# Patient Record
Sex: Female | Born: 1965 | Race: White | Hispanic: No | Marital: Married | Smoking: Never smoker
Health system: Southern US, Community
[De-identification: ages and names within clinical notes are randomized; demographics above are authoritative.]

## PROBLEM LIST (undated history)

## (undated) DIAGNOSIS — B019 Varicella without complication: Secondary | ICD-10-CM

## (undated) DIAGNOSIS — G43909 Migraine, unspecified, not intractable, without status migrainosus: Secondary | ICD-10-CM

## (undated) DIAGNOSIS — T7840XA Allergy, unspecified, initial encounter: Secondary | ICD-10-CM

## (undated) DIAGNOSIS — K219 Gastro-esophageal reflux disease without esophagitis: Secondary | ICD-10-CM

## (undated) DIAGNOSIS — M069 Rheumatoid arthritis, unspecified: Secondary | ICD-10-CM

## (undated) DIAGNOSIS — IMO0002 Reserved for concepts with insufficient information to code with codable children: Secondary | ICD-10-CM

## (undated) DIAGNOSIS — E079 Disorder of thyroid, unspecified: Secondary | ICD-10-CM

## (undated) HISTORY — DX: Allergy, unspecified, initial encounter: T78.40XA

## (undated) HISTORY — PX: CHOLECYSTECTOMY: SHX55

## (undated) HISTORY — PX: ABDOMINAL HYSTERECTOMY: SHX81

## (undated) HISTORY — DX: Varicella without complication: B01.9

## (undated) HISTORY — DX: Reserved for concepts with insufficient information to code with codable children: IMO0002

## (undated) HISTORY — PX: TUBAL LIGATION: SHX77

## (undated) HISTORY — DX: Gastro-esophageal reflux disease without esophagitis: K21.9

---

## 1999-05-06 ENCOUNTER — Encounter: Payer: Self-pay | Admitting: Family Medicine

## 1999-05-06 ENCOUNTER — Ambulatory Visit (HOSPITAL_COMMUNITY): Admission: RE | Admit: 1999-05-06 | Discharge: 1999-05-06 | Payer: Self-pay | Admitting: Family Medicine

## 2001-11-22 ENCOUNTER — Encounter: Payer: Self-pay | Admitting: Emergency Medicine

## 2001-11-22 ENCOUNTER — Emergency Department (HOSPITAL_COMMUNITY): Admission: EM | Admit: 2001-11-22 | Discharge: 2001-11-22 | Payer: Self-pay | Admitting: Emergency Medicine

## 2006-02-08 ENCOUNTER — Ambulatory Visit: Payer: Self-pay | Admitting: Family Medicine

## 2006-02-14 ENCOUNTER — Ambulatory Visit: Payer: Self-pay | Admitting: Family Medicine

## 2006-03-16 ENCOUNTER — Ambulatory Visit: Payer: Self-pay | Admitting: Family Medicine

## 2006-05-15 ENCOUNTER — Emergency Department (HOSPITAL_COMMUNITY): Admission: EM | Admit: 2006-05-15 | Discharge: 2006-05-15 | Payer: Self-pay | Admitting: Family Medicine

## 2006-06-19 ENCOUNTER — Emergency Department (HOSPITAL_COMMUNITY): Admission: EM | Admit: 2006-06-19 | Discharge: 2006-06-19 | Payer: Self-pay | Admitting: Family Medicine

## 2006-10-27 ENCOUNTER — Ambulatory Visit: Payer: Self-pay | Admitting: Family Medicine

## 2006-11-06 ENCOUNTER — Emergency Department (HOSPITAL_COMMUNITY): Admission: EM | Admit: 2006-11-06 | Discharge: 2006-11-06 | Payer: Self-pay | Admitting: Family Medicine

## 2007-03-22 ENCOUNTER — Emergency Department (HOSPITAL_COMMUNITY): Admission: EM | Admit: 2007-03-22 | Discharge: 2007-03-22 | Payer: Self-pay | Admitting: Emergency Medicine

## 2012-01-23 ENCOUNTER — Emergency Department (HOSPITAL_COMMUNITY)
Admission: EM | Admit: 2012-01-23 | Discharge: 2012-01-23 | Disposition: A | Discharge status: Home / Self Care | Payer: BC Managed Care – PPO | Attending: Emergency Medicine | Admitting: Emergency Medicine

## 2012-01-23 ENCOUNTER — Encounter (HOSPITAL_COMMUNITY): Payer: Self-pay | Admitting: *Deleted

## 2012-01-23 DIAGNOSIS — Z882 Allergy status to sulfonamides status: Secondary | ICD-10-CM | POA: Insufficient documentation

## 2012-01-23 DIAGNOSIS — Z880 Allergy status to penicillin: Secondary | ICD-10-CM | POA: Insufficient documentation

## 2012-01-23 DIAGNOSIS — Z90710 Acquired absence of both cervix and uterus: Secondary | ICD-10-CM | POA: Insufficient documentation

## 2012-01-23 DIAGNOSIS — Z885 Allergy status to narcotic agent status: Secondary | ICD-10-CM | POA: Insufficient documentation

## 2012-01-23 DIAGNOSIS — E079 Disorder of thyroid, unspecified: Secondary | ICD-10-CM | POA: Insufficient documentation

## 2012-01-23 DIAGNOSIS — Z9089 Acquired absence of other organs: Secondary | ICD-10-CM | POA: Insufficient documentation

## 2012-01-23 DIAGNOSIS — M62838 Other muscle spasm: Secondary | ICD-10-CM

## 2012-01-23 DIAGNOSIS — M5380 Other specified dorsopathies, site unspecified: Secondary | ICD-10-CM | POA: Insufficient documentation

## 2012-01-23 HISTORY — DX: Disorder of thyroid, unspecified: E07.9

## 2012-01-23 HISTORY — DX: Migraine, unspecified, not intractable, without status migrainosus: G43.909

## 2012-01-23 MED ORDER — METHOCARBAMOL 500 MG PO TABS: 1000.0000 mg | ORAL_TABLET | Freq: Four times a day (QID) | ORAL | Status: AC

## 2012-01-23 MED ORDER — IBUPROFEN 800 MG PO TABS: 800.0000 mg | ORAL_TABLET | Freq: Three times a day (TID) | ORAL | Status: AC | PRN

## 2012-01-23 MED ORDER — KETOROLAC TROMETHAMINE 30 MG/ML IJ SOLN
30.0000 mg | Freq: Once | INTRAMUSCULAR | Status: AC
  Administered 2012-01-23: 30 mg via INTRAMUSCULAR
  Filled 2012-01-23: qty 1

## 2012-01-23 MED ORDER — HYDROCODONE-ACETAMINOPHEN 5-325 MG PO TABS: ORAL_TABLET | ORAL | Status: AC

## 2012-01-23 NOTE — ED Notes (Signed)
Hx of Sinusitis, treated with Z-Pack. Continues to cough. C/o fluid behind ears. Hx of back: 2000 DX herniated disc L3-4. Went to [facility] with resolution. Neurosurgeon decided not to operation. 01/21/12 patient twisted to left .Marland Kitchen.felt sudden "pull" "bolt of lightening sharp pain". C/O of pain in area of L3-4 radiate to right flank. No radiation down legs.

## 2012-01-23 NOTE — Discharge Instructions (Signed)
Please read and follow all provided instructions.  Your diagnoses today include:  1. Muscle spasm     Tests performed today include:  Vital signs - see below for your results today  Medications prescribed:   Vicodin (hydrocodone/acetaminophen) - narcotic pain medication  You have been prescribed narcotic pain medication such as Vicodin or Percocet: DO NOT drive or perform any activities that require you to be awake and alert because this medicine can make you drowsy. BE VERY CAREFUL not to take multiple medicines containing Tylenol (also called acetaminophen). Doing so can lead to an overdose which can damage your liver and cause liver failure and possibly death.    Robaxin (methocarbamol) - muscle relaxer medication  You have been prescribed a muscle relaxer medication such as Robaxin, Flexeril, or Valium: DO NOT drive or perform any activities that require you to be awake and alert because this medicine can make you drowsy.    Ibuprofen - anti-inflammatory pain medication  Do not exceed 800mg  ibuprofen every 8 hours  You have been prescribed an anti-inflammatory medication or NSAID. Take with food. Take smallest effective dose for the shortest duration needed for your pain. Stop taking if you experience stomach pain or vomiting.   Take any prescribed medications only as directed.  Home care instructions:   Follow any educational materials contained in this packet  Please rest, use ice or heat on your back for the next several days  Do not lift, push, pull anything more than 10 pounds for the next week  Follow-up instructions: Please follow-up with your primary care provider in the next 1 week for further evaluation of your symptoms. If you do not have a primary care doctor -- see below for referral information.   Return instructions:  SEEK IMMEDIATE MEDICAL ATTENTION IF YOU HAVE:  New numbness, tingling, weakness, or problem with the use of your arms or legs  Severe  back pain not relieved with medications  Loss control of your bowels or bladder  Increasing pain in any areas of the body (such as chest or abdominal pain)  Shortness of breath, dizziness, or fainting.   Worsening nausea (feeling sick to your stomach), vomiting, fever, or sweats  Any other emergent concerns regarding your health   Additional Information:  Your vital signs today were: BP 105/61  Pulse 83  Temp(Src) 99.2 F (37.3 C) (Oral)  Resp 16  SpO2 97% If your blood pressure (BP) was elevated above 135/85 this visit, please have this repeated by your doctor within one month. -------------- No Primary Care Doctor Call [facility]  [phone] Other agencies that provide inexpensive medical care    [facility]  [phone]    [facility]  [phone]    [facility]  [phone]    [facility]  [phone]    [facility]  [phone]    [facility]  [phone] -------------- RESOURCE GUIDE:  Dental Problems  Patients with Medicaid: [facility] [phone] [address]                                            [phone] [address] Phone:  [phone]  Phone:  [phone]  If unable to pay or uninsured, contact:  [facility] or [facility]. to become qualified for the adult dental clinic.  Chronic Pain Problems Contact [facility]  [phone] Patients need to be referred by their primary care doctor.  Insufficient Money for Medicine Contact [employer]:  call "211" or [facility] [phone].  Psychological Services [facility]  [phone] [facility]  [phone] [facility]   [phone] (emergency services [phone])  Substance Abuse Resources [facility]  [phone] [facility] [phone] [facility]  [phone] Floydene Flock [phone] Residential & Outpatient Substance Abuse Program  [phone]  Abuse/Neglect [facility] Child Abuse Hotline ([phone] [facility] Child Abuse Hotline [phone] (After Hours)  Emergency Shelter [facility] [phone]  Maternity Homes [facility] at [facility] [place] [facility] [phone] [facility] [phone]  [facility] Resources  [facility]     [employer]                          [facility]. [address] [place]                       [address]      [address]  Blondell Reveal Phone:  [phone]                                   Phone:  [phone]                 Phone:  [phone]  [facility] Mental [facility] Phone:  [phone]  [facility] Child Abuse Hotline [phone] (After Hours)

## 2012-01-23 NOTE — ED Provider Notes (Signed)
History     CSN: [id number]  Arrival date & time 01/23/12  1046   First MD Initiated Contact with Patient 01/23/12 1116      Chief Complaint  Patient presents with  . Back Pain    (Consider location/radiation/quality/duration/timing/severity/associated sxs/prior treatment) HPI Comments: Patient presents with right middle back pain that started 2 days ago after she twisted her back. Patient states that she has been coughing for the past week due to an upper respiratory tract infection that was treated with a Z-Pak she has completed. Her back pain is worse with movement and with deep breathing. Patient is not short of breath. She states she has to hold her back when she coughs. Patient took a Flexeril that she had at home last night with mild relief.  Patient is a 46 y.o. female presenting with back pain. The history is provided by the patient.  Back Pain  This is a new problem. The current episode started 2 days ago. The problem occurs constantly. The problem has not changed since onset.The pain is associated with twisting. The quality of the pain is described as aching. The pain does not radiate. Pertinent negatives include no chest pain, no fever, no numbness, no abdominal pain, no bowel incontinence, no perianal numbness, no bladder incontinence, no dysuria, no pelvic pain, no leg pain, no paresthesias, no paresis, no tingling and no weakness. She has tried muscle relaxants and NSAIDs for the symptoms. The treatment provided mild relief.    Past Medical History  Diagnosis Date  . Thyroid disease   . Migraine headache     Past Surgical History  Procedure Date  . Cholecystectomy   . Abdominal hysterectomy   . Tubal ligation     No family history on file.  History  Substance Use Topics  . Smoking status: Never Smoker   . Smokeless tobacco: Not on file  . Alcohol Use: No    OB History    Grav Para Term Preterm Abortions TAB SAB Ect Mult Living                  Review  of Systems  Constitutional: Negative for fever and unexpected weight change.  HENT: Positive for congestion and rhinorrhea. Negative for ear pain.   Respiratory: Positive for cough. Negative for shortness of breath.   Cardiovascular: Negative for chest pain.  Gastrointestinal: Negative for abdominal pain, constipation and bowel incontinence.       Negative for fecal incontinence.   Genitourinary: Negative for bladder incontinence, dysuria, hematuria, flank pain, vaginal bleeding, vaginal discharge and pelvic pain.       Negative for urinary incontinence or retention.  Musculoskeletal: Positive for back pain.  Neurological: Negative for tingling, weakness, numbness and paresthesias.       Denies saddle paresthesias.    Allergies  Codeine sulfate; Pyridium; Flagyl; Penicillins; and Sulfa antibiotics  Home Medications   Current Outpatient Rx  Name Route Sig Dispense Refill  . LEVOTHYROXINE SODIUM 50 MCG PO TABS Oral Take 50 mcg by mouth daily.    . TOPIRAMATE 25 MG PO TABS Oral Take 25 mg by mouth 2 (two) times daily.      BP 105/61  Pulse 83  Temp(Src) 99.2 F (37.3 C) (Oral)  Resp 16  SpO2 97%  Physical Exam  Nursing note and vitals reviewed. Constitutional: She is oriented to person, place, and time. She appears well-developed and well-nourished.  HENT:  Head: Normocephalic and atraumatic.  Eyes: Conjunctivae are normal.  Neck:  Normal range of motion. Neck supple.  Pulmonary/Chest: Effort normal.  Abdominal: Soft. There is no tenderness. There is no CVA tenderness.  Musculoskeletal: Normal range of motion.       Cervical back: She exhibits no tenderness and no bony tenderness.       Thoracic back: She exhibits tenderness. She exhibits no bony tenderness.       Lumbar back: She exhibits no tenderness and no bony tenderness.       Back:       There is no tenderness to palpation over cervical/thoracic/lumbar/sacral spine. Tenderness to palpation over R thoracic paraspinal  and posterior intercostal muscles. No step-off noted with palpation of spine.   Neurological: She is alert and oriented to person, place, and time. She has normal strength and normal reflexes. No sensory deficit.       5/5 strength in entire lower extremities bilaterally. No sensation deficit.   Skin: Skin is warm and dry. No rash noted.  Psychiatric: She has a normal mood and affect.    ED Course  Procedures (including critical care time)  Labs Reviewed - No data to display No results found.   1. Muscle spasm    12:16 PM Patient seen and examined. Work-up initiated. Medications ordered.   Vital signs reviewed and are as follows: Filed Vitals:   01/23/12 1057  BP: 105/61  Pulse: 83  Temp: 99.2 F (37.3 C)  Resp: 16   12:16 PM Patient was seen and examined. No red flag s/s of low back pain. Patient was counseled on back pain precautions and told to do activity as tolerated but do not lift, push, or pull heavy objects more than 10 pounds for the next week.  Patient counseled to use ice or heat on back for no longer than 15 minutes every hour.   Patient prescribed muscle relaxer and counseled on proper use of muscle relaxant medication.    Patient prescribed narcotic pain medicine and counseled on proper use of narcotic pain medications. Counseled not to combine this medication with others containing tylenol.   Urged patient not to drink alcohol, drive, or perform any other activities that requires focus while taking either of these medications.  Patient urged to follow-up with PCP if pain does not improve with treatment and rest or if pain becomes recurrent. Urged to return with worsening severe pain, loss of bowel or bladder control, trouble walking.   The patient verbalizes understanding and agrees with the plan.  MDM  Patient with back pain. Suspect 2/2 cough, mild twisting injury. No neurological deficits. Patient is ambulatory. No warning symptoms of back pain including:  loss of bowel or bladder control, night sweats, waking from sleep with back pain, unexplained fevers or weight loss, h/o cancer, IVDU, recent trauma. No concern for cauda equina, epidural abscess, or other serious cause of back pain. Conservative measures such as rest, ice/heat and pain medicine indicated with PCP follow-up if no improvement with conservative management.         Renne Crigler, [place] 01/23/12 1216

## 2012-01-23 NOTE — ED Provider Notes (Signed)
Medical screening examination/treatment/procedure(s) were performed by non-physician practitioner and as supervising physician I was immediately available for consultation/collaboration.  Jermey Closs, MD 01/23/12 1521 

## 2013-03-03 ENCOUNTER — Encounter (HOSPITAL_COMMUNITY): Payer: Self-pay | Admitting: Emergency Medicine

## 2013-03-03 ENCOUNTER — Emergency Department (HOSPITAL_COMMUNITY)
Admission: EM | Admit: 2013-03-03 | Discharge: 2013-03-03 | Disposition: A | Discharge status: Home / Self Care | Payer: BC Managed Care – PPO | Attending: Emergency Medicine | Admitting: Emergency Medicine

## 2013-03-03 ENCOUNTER — Emergency Department (HOSPITAL_COMMUNITY): Payer: BC Managed Care – PPO

## 2013-03-03 DIAGNOSIS — Z79899 Other long term (current) drug therapy: Secondary | ICD-10-CM | POA: Insufficient documentation

## 2013-03-03 DIAGNOSIS — J40 Bronchitis, not specified as acute or chronic: Secondary | ICD-10-CM

## 2013-03-03 DIAGNOSIS — J329 Chronic sinusitis, unspecified: Secondary | ICD-10-CM

## 2013-03-03 DIAGNOSIS — R059 Cough, unspecified: Secondary | ICD-10-CM | POA: Insufficient documentation

## 2013-03-03 DIAGNOSIS — E079 Disorder of thyroid, unspecified: Secondary | ICD-10-CM | POA: Insufficient documentation

## 2013-03-03 DIAGNOSIS — R05 Cough: Secondary | ICD-10-CM | POA: Insufficient documentation

## 2013-03-03 DIAGNOSIS — J019 Acute sinusitis, unspecified: Secondary | ICD-10-CM | POA: Insufficient documentation

## 2013-03-03 DIAGNOSIS — Z8679 Personal history of other diseases of the circulatory system: Secondary | ICD-10-CM | POA: Insufficient documentation

## 2013-03-03 DIAGNOSIS — J3489 Other specified disorders of nose and nasal sinuses: Secondary | ICD-10-CM | POA: Insufficient documentation

## 2013-03-03 MED ORDER — PREDNISONE 20 MG PO TABS: ORAL_TABLET | ORAL | Status: DC

## 2013-03-03 MED ORDER — FEXOFENADINE-PSEUDOEPHED ER 60-120 MG PO TB12: 1.0000 | ORAL_TABLET | Freq: Two times a day (BID) | ORAL | Status: DC

## 2013-03-03 MED ORDER — ALBUTEROL SULFATE HFA 108 (90 BASE) MCG/ACT IN AERS
2.0000 | INHALATION_SPRAY | Freq: Once | RESPIRATORY_TRACT | Status: AC
  Administered 2013-03-03: 2 via RESPIRATORY_TRACT
  Filled 2013-03-03: qty 6.7

## 2013-03-03 NOTE — ED Provider Notes (Signed)
History     CSN: [id number]  Arrival date & time 03/03/13  2956   First MD Initiated Contact with Patient 03/03/13 1004      Chief Complaint  Patient presents with  . Fever  . Nasal Congestion  . Cough    (Consider location/radiation/quality/duration/timing/severity/associated sxs/prior treatment) HPI Denise Booth is a 47 y.o. female who presents to ED with complaint of fevers, congestion, sore throat, cough. States symptoms began with sinus congestion about 2 wks ago. She was diagnosed with sinusitis, treated with doxycycline. States finished a week of it. States was then started on clindamycin, which she states gave her thrush. States did not finish full course of clindamycin, currently on prednisone and diflucan. States two days ago, she started having increased productive cough, brown sputum, fevers up to 100. States has hx of pneumonia, and this feels similar. States taking tylenol for fever, last taken at 6am this morning. Denies shortness of breath. Denies headache, neck pain, stiffness. No decongestants taken.   Past Medical History  Diagnosis Date  . Thyroid disease   . Migraine headache     Past Surgical History  Procedure Laterality Date  . Cholecystectomy    . Abdominal hysterectomy    . Tubal ligation      No family history on file.  History  Substance Use Topics  . Smoking status: Never Smoker   . Smokeless tobacco: Never Used  . Alcohol Use: No    OB History   Grav Para Term Preterm Abortions TAB SAB Ect Mult Living                  Review of Systems  Constitutional: Positive for fever and chills.  HENT: Negative for neck pain and neck stiffness.   Respiratory: Positive for cough and shortness of breath. Negative for chest tightness and wheezing.   Cardiovascular: Negative.   Gastrointestinal: Negative for nausea, vomiting and abdominal pain.  Genitourinary: Negative for dysuria.  Skin: Negative.   Neurological: Negative for dizziness,  weakness, numbness and headaches.  All other systems reviewed and are negative.    Allergies  Codeine sulfate; Hydrocodone; Pyridium; Tramadol; Flagyl; Penicillins; and Sulfa antibiotics  Home Medications   Current Outpatient Rx  Name  Route  Sig  Dispense  Refill  . levothyroxine (SYNTHROID, LEVOTHROID) 50 MCG tablet   Oral   Take 50 mcg by mouth daily.         Marland Kitchen topiramate (TOPAMAX) 25 MG tablet   Oral   Take 25 mg by mouth 2 (two) times daily.           BP 124/55  Pulse 81  Temp(Src) 99 F (37.2 C) (Oral)  Resp 22  Wt 250 lb (113.399 kg)  SpO2 97%  Physical Exam  Nursing note and vitals reviewed. Constitutional: She is oriented to person, place, and time. She appears well-developed and well-nourished. No distress.  HENT:  Head: Normocephalic and atraumatic.  Right Ear: Tympanic membrane, external ear and ear canal normal.  Left Ear: Tympanic membrane, external ear and ear canal normal.  Nose: Rhinorrhea present. Right sinus exhibits no maxillary sinus tenderness and no frontal sinus tenderness. Left sinus exhibits maxillary sinus tenderness and frontal sinus tenderness.  Mouth/Throat: Uvula is midline, oropharynx is clear and moist and mucous membranes are normal.  Eyes: Conjunctivae are normal.  Neck: Neck supple.  Cardiovascular: Normal rate, regular rhythm and normal heart sounds.   Pulmonary/Chest: Effort normal and breath sounds normal. No respiratory distress. She has  no wheezes. She has no rales.  Musculoskeletal: She exhibits no edema.  Lymphadenopathy:    She has no cervical adenopathy.  Neurological: She is alert and oriented to person, place, and time.  Skin: Skin is warm and dry.    ED Course  Procedures (including critical care time)  No results found for this or any previous visit. Dg Chest 2 View  03/03/2013  *RADIOLOGY REPORT*  Clinical Data: Congestion and cough and fever.  CHEST - 2 VIEW  Comparison: 07/10/2008  Findings:   The  cardiomediastinal silhouette is unremarkable. The lungs are clear. There is no evidence of focal airspace disease, pulmonary edema, suspicious pulmonary nodule/mass, pleural effusion, or pneumothorax. No acute bony abnormalities are identified.  IMPRESSION: No evidence of active cardiopulmonary disease.   Original Report Authenticated By: Harmon Pier, M.D.      1. Bronchitis   2. Sinusitis       MDM  Pt with sinus pain tenderness congestion, and now productive cough. VS normal. CXR negative. She is afebrile here in ED. Recently finished doxycycline course. Most likely viral sinusitis and bronchitis. Will start on prednisone taper, albuterol inhaler, decongestants. Follow up with PCP.   Filed Vitals:   03/03/13 1009 03/03/13 1022 03/03/13 1208 03/03/13 1235  BP: 124/55   115/64  Pulse: 81   70  Temp: 98 F (36.7 C) 99 F (37.2 C)  97.9 F (36.6 C)  TempSrc:  Oral    Resp: 22   20  Weight:  250 lb (113.399 kg)    SpO2: 97%  99% 97%           Ramonda Galyon A Dotti Busey, PA-C 03/03/13 1526

## 2013-03-03 NOTE — ED Notes (Signed)
Discharge instructions reviewed w/ pt., verbalizes understanding. Two prescription provided at discharge.

## 2013-03-03 NOTE — ED Notes (Signed)
Pt presents W/ 2 week hx starting w/ sinusitis which she was given doxycycline for one wk, then placed on clindamycin TID, after another week pt developed thrush in her mouth so all antibx were stopped. Is now taking Prednisone and Diflucan twice a day. [place] and now she has fever, ear pain, productive cough (brown), sore throat and nasal congestion.

## 2013-03-04 NOTE — ED Provider Notes (Signed)
Medical screening examination/treatment/procedure(s) were performed by non-physician practitioner and as supervising physician I was immediately available for consultation/collaboration.  Gissell Barra, MD 03/04/13 0753 

## 2015-03-24 ENCOUNTER — Other Ambulatory Visit: Payer: Self-pay | Admitting: Internal Medicine

## 2015-03-24 DIAGNOSIS — G43109 Migraine with aura, not intractable, without status migrainosus: Secondary | ICD-10-CM

## 2015-03-31 ENCOUNTER — Other Ambulatory Visit: Payer: Self-pay

## 2015-06-20 ENCOUNTER — Emergency Department (HOSPITAL_COMMUNITY)
Admission: EM | Admit: 2015-06-20 | Discharge: 2015-06-20 | Disposition: A | Discharge status: Home / Self Care | Payer: Self-pay | Attending: Emergency Medicine | Admitting: Emergency Medicine

## 2015-06-20 ENCOUNTER — Encounter (HOSPITAL_COMMUNITY): Payer: Self-pay | Admitting: *Deleted

## 2015-06-20 DIAGNOSIS — R110 Nausea: Secondary | ICD-10-CM

## 2015-06-20 DIAGNOSIS — G43109 Migraine with aura, not intractable, without status migrainosus: Secondary | ICD-10-CM | POA: Insufficient documentation

## 2015-06-20 DIAGNOSIS — E079 Disorder of thyroid, unspecified: Secondary | ICD-10-CM | POA: Insufficient documentation

## 2015-06-20 DIAGNOSIS — Z880 Allergy status to penicillin: Secondary | ICD-10-CM | POA: Insufficient documentation

## 2015-06-20 DIAGNOSIS — R1013 Epigastric pain: Secondary | ICD-10-CM | POA: Insufficient documentation

## 2015-06-20 DIAGNOSIS — Z79899 Other long term (current) drug therapy: Secondary | ICD-10-CM | POA: Insufficient documentation

## 2015-06-20 LAB — CBC
HEMATOCRIT: 37.9 % (ref 36.0–46.0)
HEMOGLOBIN: 12.7 g/dL (ref 12.0–15.0)
MCH: 28.2 pg (ref 26.0–34.0)
MCHC: 33.5 g/dL (ref 30.0–36.0)
MCV: 84.2 fL (ref 78.0–100.0)
Platelets: 246 10*3/uL (ref 150–400)
RBC: 4.5 MIL/uL (ref 3.87–5.11)
RDW: 13 % (ref 11.5–15.5)
WBC: 8.5 10*3/uL (ref 4.0–10.5)

## 2015-06-20 LAB — COMPREHENSIVE METABOLIC PANEL
ALT: 27 U/L (ref 14–54)
ANION GAP: 7 (ref 5–15)
AST: 28 U/L (ref 15–41)
Albumin: 3.9 g/dL (ref 3.5–5.0)
Alkaline Phosphatase: 70 U/L (ref 38–126)
BILIRUBIN TOTAL: 0.3 mg/dL (ref 0.3–1.2)
CALCIUM: 9 mg/dL (ref 8.9–10.3)
CHLORIDE: 106 mmol/L (ref 101–111)
CO2: 26 mmol/L (ref 22–32)
CREATININE: 0.85 mg/dL (ref 0.44–1.00)
Glucose, Bld: 99 mg/dL (ref 65–99)
Potassium: 4.3 mmol/L (ref 3.5–5.1)
Sodium: 139 mmol/L (ref 135–145)
Total Protein: 6.8 g/dL (ref 6.5–8.1)

## 2015-06-20 LAB — LIPASE, BLOOD: LIPASE: 19 U/L — AB (ref 22–51)

## 2015-06-20 LAB — I-STAT TROPONIN, ED: Troponin i, poc: 0 ng/mL (ref 0.00–0.08)

## 2015-06-20 MED ORDER — DIPHENHYDRAMINE HCL 50 MG/ML IJ SOLN
25.0000 mg | Freq: Once | INTRAMUSCULAR | Status: AC
  Administered 2015-06-20: 25 mg via INTRAVENOUS
  Filled 2015-06-20: qty 1

## 2015-06-20 MED ORDER — KETOROLAC TROMETHAMINE 30 MG/ML IJ SOLN
30.0000 mg | Freq: Once | INTRAMUSCULAR | Status: AC
  Administered 2015-06-20: 30 mg via INTRAVENOUS
  Filled 2015-06-20: qty 1

## 2015-06-20 MED ORDER — PROCHLORPERAZINE EDISYLATE 5 MG/ML IJ SOLN
10.0000 mg | Freq: Once | INTRAMUSCULAR | Status: AC
  Administered 2015-06-20: 10 mg via INTRAVENOUS
  Filled 2015-06-20: qty 2

## 2015-06-20 MED ORDER — SODIUM CHLORIDE 0.9 % IV BOLUS (SEPSIS)
1000.0000 mL | Freq: Once | INTRAVENOUS | Status: AC
  Administered 2015-06-20: 1000 mL via INTRAVENOUS

## 2015-06-20 NOTE — ED Notes (Addendum)
PA at bedside.

## 2015-06-20 NOTE — Discharge Instructions (Signed)
1. Medications: usual home medications 2. Treatment: rest, drink plenty of fluids,  3. Follow Up: Please followup with your primary doctor and neurologist in 2-3 days for discussion of your diagnoses and further evaluation after today's visit; Please return to the ER for worsening symptoms, other concerns   Migraine Headache A migraine headache is an intense, throbbing pain on one or both sides of your head. A migraine can last for 30 minutes to several hours. CAUSES  The exact cause of a migraine headache is not always known. However, a migraine may be caused when nerves in the brain become irritated and release chemicals that cause inflammation. This causes pain. Certain things may also trigger migraines, such as:  Alcohol.  Smoking.  Stress.  Menstruation.  Aged cheeses.  Foods or drinks that contain nitrates, glutamate, aspartame, or tyramine.  Lack of sleep.  Chocolate.  Caffeine.  Hunger.  Physical exertion.  Fatigue.  Medicines used to treat chest pain (nitroglycerine), birth control pills, estrogen, and some blood pressure medicines. SIGNS AND SYMPTOMS  Pain on one or both sides of your head.  Pulsating or throbbing pain.  Severe pain that prevents daily activities.  Pain that is aggravated by any physical activity.  Nausea, vomiting, or both.  Dizziness.  Pain with exposure to bright lights, loud noises, or activity.  General sensitivity to bright lights, loud noises, or smells. Before you get a migraine, you may get warning signs that a migraine is coming (aura). An aura may include:  Seeing flashing lights.  Seeing bright spots, halos, or zigzag lines.  Having tunnel vision or blurred vision.  Having feelings of numbness or tingling.  Having trouble talking.  Having muscle weakness. DIAGNOSIS  A migraine headache is often diagnosed based on:  Symptoms.  Physical exam.  A CT scan or MRI of your head. These imaging tests cannot  diagnose migraines, but they can help rule out other causes of headaches. TREATMENT Medicines may be given for pain and nausea. Medicines can also be given to help prevent recurrent migraines.  HOME CARE INSTRUCTIONS  Only take over-the-counter or prescription medicines for pain or discomfort as directed by your health care provider. The use of long-term narcotics is not recommended.  Lie down in a dark, quiet room when you have a migraine.  Keep a journal to find out what may trigger your migraine headaches. For example, write down:  What you eat and drink.  How much sleep you get.  Any change to your diet or medicines.  Limit alcohol consumption.  Quit smoking if you smoke.  Get 7-9 hours of sleep, or as recommended by your health care provider.  Limit stress.  Keep lights dim if bright lights bother you and make your migraines worse. SEEK IMMEDIATE MEDICAL CARE IF:   Your migraine becomes severe.  You have a fever.  You have a stiff neck.  You have vision loss.  You have muscular weakness or loss of muscle control.  You start losing your balance or have trouble walking.  You feel faint or pass out.  You have severe symptoms that are different from your first symptoms. MAKE SURE YOU:   Understand these instructions.  Will watch your condition.  Will get help right away if you are not doing well or get worse. Document Released: 10/25/2005 Document Revised: 03/11/2014 Document Reviewed: 07/02/2013 [facility] Patient Information 2015 [place], [place]. This information is not intended to replace advice given to you by your health care provider. Make sure you  discuss any questions you have with your health care provider.

## 2015-06-20 NOTE — ED Provider Notes (Signed)
CSN: [id number]     Arrival date & time 06/20/15  1755 History   First MD Initiated Contact with Patient 06/20/15 1830     Chief Complaint  Patient presents with  . Headache  . Abdominal Pain  . Nausea     (Consider location/radiation/quality/duration/timing/severity/associated sxs/prior Treatment) Patient is a 49 y.o. female presenting with headaches and abdominal pain. The history is provided by the patient and medical records. No language interpreter was used.  Headache Associated symptoms: abdominal pain and nausea   Associated symptoms: no back pain, no cough, no diarrhea, no fatigue, no fever, no neck stiffness and no vomiting   Abdominal Pain Associated symptoms: nausea   Associated symptoms: no chest pain, no constipation, no cough, no diarrhea, no dysuria, no fatigue, no fever, no hematuria, no shortness of breath and no vomiting      DEREONA KOLODNY is a 49 y.o. female  with a hx of migraine headache presents to the Emergency Department complaining of gradual, persistent, progressively worsening generalized throbbing headache onset yesterday.  Patient reports associated vision changes which are normal for her migraine headaches. She reports gradual onset and denies thunder clap or worst headache of her life.  She has taken Tylenol and Topamax without relief.  She reports that she has persistent nausea but has been able to keep down small amounts of liquids and foods today. She also has associated epigastric bloating and discomfort. She denies chest pain or shortness of breath.  She denies falling or known trauma to her chest or abdomen.. Associated symptoms include photophobia. Nothing seems to make it better or worse.  Pt denies fever, chills, neck pain, neck stiffness, chest pain, shortness of breath, weakness, syncope, dysuria, hematuria.     Past Medical History  Diagnosis Date  . Thyroid disease   . Migraine headache    Past Surgical History  Procedure Laterality Date   . Cholecystectomy    . Abdominal hysterectomy    . Tubal ligation     No family history on file. Social History  Substance Use Topics  . Smoking status: Never Smoker   . Smokeless tobacco: Never Used  . Alcohol Use: No   OB History    No data available     Review of Systems  Constitutional: Negative for fever, diaphoresis, appetite change, fatigue and unexpected weight change.  HENT: Negative for mouth sores.   Eyes: Positive for visual disturbance.  Respiratory: Negative for cough, chest tightness, shortness of breath and wheezing.   Cardiovascular: Negative for chest pain.  Gastrointestinal: Positive for nausea and abdominal pain. Negative for vomiting, diarrhea and constipation.  Endocrine: Negative for polydipsia, polyphagia and polyuria.  Genitourinary: Negative for dysuria, urgency, frequency and hematuria.  Musculoskeletal: Negative for back pain and neck stiffness.  Skin: Negative for rash.  Allergic/Immunologic: Negative for immunocompromised state.  Neurological: Positive for headaches. Negative for syncope and light-headedness.  Hematological: Does not bruise/bleed easily.  Psychiatric/Behavioral: Negative for sleep disturbance. The patient is not nervous/anxious.       Allergies  Codeine sulfate; Hydrocodone; Pyridium; Tramadol; Flagyl; Penicillins; and Sulfa antibiotics  Home Medications   Prior to Admission medications   Medication Sig Start Date End Date Taking? Authorizing Provider  fexofenadine-pseudoephedrine (ALLEGRA-D) 60-120 MG per tablet Take 1 tablet by mouth every 12 (twelve) hours. 03/03/13   Tatyana Kirichenko, PA-C  fluconazole (DIFLUCAN) 100 MG tablet Take 100 mg by mouth 2 (two) times daily. Pt is to take med  For a week  Historical Provider, MD  levothyroxine (SYNTHROID, LEVOTHROID) 50 MCG tablet Take 50 mcg by mouth daily.    Historical Provider, MD  predniSONE (DELTASONE) 10 MG tablet Take 10 mg by mouth daily.    Historical Provider, MD   predniSONE (DELTASONE) 20 MG tablet Take 5 tab day 1, take 4 tab day 2, take 3 tab day 3, take 2 tab day 4, and take 1 tab day 5 03/03/13   Tatyana Kirichenko, PA-C  topiramate (TOPAMAX) 25 MG tablet Take 25 mg by mouth 2 (two) times daily.    Historical Provider, MD   BP 108/72 mmHg  Pulse 70  Temp(Src) 98 F (36.7 C) (Oral)  Resp 18  SpO2 100% Physical Exam  Constitutional: She is oriented to person, place, and time. She appears well-developed and well-nourished. No distress.  HENT:  Head: Normocephalic and atraumatic.  Mouth/Throat: Oropharynx is clear and moist.  Eyes: Conjunctivae and EOM are normal. Pupils are equal, round, and reactive to light. No scleral icterus.  No horizontal, vertical or rotational nystagmus  Neck: Normal range of motion. Neck supple.  Full active and passive ROM without pain No midline or paraspinal tenderness No nuchal rigidity or meningeal signs  Cardiovascular: Normal rate, regular rhythm, normal heart sounds and intact distal pulses.   Pulmonary/Chest: Effort normal and breath sounds normal. No respiratory distress. She has no wheezes. She has no rales.  Abdominal: Soft. Bowel sounds are normal. There is tenderness in the epigastric area. There is no rebound and no guarding.  Mild epigastric abd tenderness without guarding or rebound  Musculoskeletal: Normal range of motion.  Lymphadenopathy:    She has no cervical adenopathy.  Neurological: She is alert and oriented to person, place, and time. She has normal reflexes. No cranial nerve deficit. She exhibits normal muscle tone. Coordination normal.  Mental Status:  Alert, oriented, thought content appropriate. Speech fluent without evidence of aphasia. Able to follow 2 step commands without difficulty.  Cranial Nerves:  II:  Peripheral visual fields grossly normal, pupils equal, round, reactive to light III,IV, VI: ptosis not present, extra-ocular motions intact bilaterally  V,VII: smile symmetric,  facial light touch sensation equal VIII: hearing grossly normal bilaterally  IX,X: midline uvula rise  XI: bilateral shoulder shrug equal and strong XII: midline tongue extension  Motor:  5/5 in upper and lower extremities bilaterally including strong and equal grip strength and dorsiflexion/plantar flexion Sensory: Pinprick and light touch normal in all extremities.  Deep Tendon Reflexes: 2+ and symmetric  Cerebellar: normal finger-to-nose with bilateral upper extremities Gait: normal gait and balance CV: distal pulses palpable throughout   Skin: Skin is warm and dry. No rash noted. She is not diaphoretic.  Psychiatric: She has a normal mood and affect. Her behavior is normal. Judgment and thought content normal.  Nursing note and vitals reviewed.   ED Course  Procedures (including critical care time) Labs Review Labs Reviewed  COMPREHENSIVE METABOLIC PANEL - Abnormal; Notable for the following:    BUN <5 (*)    All other components within normal limits  LIPASE, BLOOD - Abnormal; Notable for the following:    Lipase 19 (*)    All other components within normal limits  CBC  I-STAT TROPOININ, ED    Imaging Review No results found.   I, Barba Solt, Dahlia Client, personally reviewed and evaluated these images and lab results as part of my medical decision-making.   EKG Interpretation None       ED ECG REPORT   Date: 06/20/2015  Rate: 65  Rhythm: normal sinus rhythm  QRS Axis: normal  Intervals: normal  ST/T Wave abnormalities: normal  Conduction Disutrbances:none  Narrative Interpretation: No old for comparison, nonischemic  Old EKG Reviewed: none available  I have personally reviewed the EKG tracing and agree with the computerized printout as noted.   MDM   Final diagnoses:  Migraine with aura and without status migrainosus, not intractable  Nausea  Abdominal discomfort, epigastric   SHAMYRA FARIAS presents with worsening migraine headache similar to prior.  She also has associated epigastric abdominal discomfort described as bloating.  Normal neurologic exam.  We'll give migraine cocktail and reevaluate. Patient's abdominal symptoms may be a component of an abdominal migraine.  7:44 PM Patient with complete resolution of headache and abdominal symptoms after migraine cocktail. EKG nonischemic. Labs reassuring. Patient with normal neurologic exam. She ambulates here in the emergency department without any difficulty. Presentation is like pts typical HA and non concerning for [facility], ICH, Meningitis, or temporal arteritis. Pt is afebrile with no focal neuro deficits, nuchal rigidity, or change in vision.  Will discharge home and have her follow-up with primary care and neurology.  BP 108/72 mmHg  Pulse 70  Temp(Src) 98 F (36.7 C) (Oral)  Resp 18  SpO2 100%    Dierdre Forth, PA-C 06/20/15 1948  Vanetta Mulders, MD 06/25/15 1758

## 2015-06-20 NOTE — ED Notes (Signed)
Patient with reported onset of abd pain on yesterday with nausea.  Patient states she has bloating in her mid abdomen.  Patient states her bowels are irregular.  Patient reports she has taken tylenol and topamax for headache.  Patient has had small amount food, she is tolerating fluids.   Patient denies fevers.  Patient denies sob.  Patient has periods of near syncope on yesterday and today.  She states that sometimes this is associated with her migraine.  Patient denies any other neuro changes.  Her headache and vision changes started on yesterday.  Patient has not had any meds for nausea.  She denies trauma.

## 2017-02-16 ENCOUNTER — Encounter: Payer: Self-pay | Admitting: Family Medicine

## 2017-02-16 ENCOUNTER — Ambulatory Visit (INDEPENDENT_AMBULATORY_CARE_PROVIDER_SITE_OTHER): Payer: BLUE CROSS/BLUE SHIELD | Admitting: Family Medicine

## 2017-02-16 DIAGNOSIS — G43909 Migraine, unspecified, not intractable, without status migrainosus: Secondary | ICD-10-CM | POA: Diagnosis not present

## 2017-02-16 DIAGNOSIS — K219 Gastro-esophageal reflux disease without esophagitis: Secondary | ICD-10-CM | POA: Diagnosis not present

## 2017-02-16 DIAGNOSIS — R21 Rash and other nonspecific skin eruption: Secondary | ICD-10-CM

## 2017-02-16 DIAGNOSIS — Z1211 Encounter for screening for malignant neoplasm of colon: Secondary | ICD-10-CM | POA: Diagnosis not present

## 2017-02-16 NOTE — Progress Notes (Signed)
Subjective:     Patient ID: Denise Booth, female   DOB: Nov 13, 1965, 51 y.o.   MRN: [id number]  HPI Patient is here to establish care. Her past medical history is reviewed. She has occasional GERD symptoms. She has history of obesity. Migraine headaches which are somewhat infrequent. Usually has about one every 2-3 months. Usually triggered by poor sleep and occasionally foods. She has multiple drug allergies which are listed.  She takes occasional proton pump inhibitor for acid reflux. She's had previous hysterectomy 2008. Previous left ovariectomy. Right ovary in place. Cholecystectomy 2002.  Patient is married. 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 She is currently looking for new employment. Never smoked. No alcohol.  Family history of TIAs in her mother. Her father had multiple myeloma and heart disease and hyperlipidemia history.  She had colonoscopy back in 2008. Has never had mammogram. Hysterectomy issues as above.  She has recurrent facial rash central face nose and cheek area. Erythematous papules. Usually very transient. Sometimes itches. Usually goes away after couple days. No clear triggering factors.  Steady weight gain over recent years. She has frequent constipation symptoms sometimes not relieved even with MiraLAX and stool softeners. Has recently had some occasional right upper quadrant and epigastric pain. No melena. No nausea or vomiting.  Past Medical History:  Diagnosis Date  . Allergy   . Chicken pox   . GERD (gastroesophageal reflux disease)   . Migraine headache   . Thyroid disease   . Ulcer [facility])    Past Surgical History:  Procedure Laterality Date  . ABDOMINAL HYSTERECTOMY    . CHOLECYSTECTOMY    . TUBAL LIGATION      reports that she has never smoked. She has never used smokeless tobacco. She reports that she does not drink alcohol or use drugs. family history is not on file. Allergies  Allergen Reactions  .  Codeine Sulfate Itching and Nausea And Vomiting  . Hydrocodone Itching and Nausea And Vomiting  . Pyridium [Phenazopyridine Hcl] Hives  . Tramadol Itching and Nausea And Vomiting  . Flagyl [Metronidazole Hcl] Rash  . Penicillins Rash  . Sulfa Antibiotics Rash     Review of Systems  Constitutional: Negative for activity change, appetite change, fatigue, fever and unexpected weight change.  HENT: Negative for ear pain, hearing loss, sore throat and trouble swallowing.   Eyes: Negative for visual disturbance.  Respiratory: Negative for cough and shortness of breath.   Cardiovascular: Negative for chest pain and palpitations.  Gastrointestinal: Positive for abdominal pain and constipation. Negative for blood in stool and diarrhea.  Endocrine: Negative for polydipsia and polyuria.  Genitourinary: Negative for dysuria and hematuria.  Musculoskeletal: Negative for arthralgias, back pain and myalgias.  Skin: Positive for rash.  Neurological: Negative for dizziness, syncope and headaches.  Hematological: Negative for adenopathy.  Psychiatric/Behavioral: Negative for confusion and dysphoric mood.       Objective:   Physical Exam  Constitutional: She appears well-developed and well-nourished.  Eyes: Pupils are equal, round, and reactive to light.  Neck: Neck supple. No JVD present. No thyromegaly present.  Cardiovascular: Normal rate and regular rhythm.  Exam reveals no gallop.   Pulmonary/Chest: Effort normal and breath sounds normal. No respiratory distress. She has no wheezes. She has no rales.  Abdominal: Soft. Bowel sounds are normal. She exhibits no mass. There is no rebound and no guarding.  She has some mild tenderness to palpation right upper quadrant. No hepatomegaly noted.  No masses palpated.  Musculoskeletal: She exhibits no edema.  Neurological: She is alert.  Skin: Rash noted.  Patient has a couple of nonspecific erythematous papules on the right and left malar area and  right side of nose. No pustules.       Assessment:     #1 Morbid obesity  #2 facial skin rash which is recurrent and question rosacea  #3 recent intermittent right upper quadrant abdominal pain in a patient with previous cholecystectomy  #4 history of migraine headaches  #5 Gerd    Plan:     -Set up complete physical and obtain screening lab work then -We discussed possible treatment options for rash and at this point she wishes to observe and would consider possibilities such as doxycycline. She cannot take metronidazole -Patient needs to set up initial mammogram -We'll go ahead and refer for repeat colonoscopy since she is over age 62 and last colonoscopy 42 years ago  Eulas Post MD [facility]

## 2017-02-16 NOTE — Progress Notes (Signed)
Pre visit review using our clinic review tool, if applicable. No additional management support is needed unless otherwise documented below in the visit note. 

## 2017-02-16 NOTE — Patient Instructions (Addendum)
Constipation, Adult Constipation is when a person has fewer bowel movements in a week than normal, has difficulty having a bowel movement, or has stools that are dry, hard, or larger than normal. Constipation may be caused by an underlying condition. It may become worse with age if a person takes certain medicines and does not take in enough fluids. Follow these instructions at home: Eating and drinking    Eat foods that have a lot of fiber, such as fresh fruits and vegetables, whole grains, and beans.  Limit foods that are high in fat, low in fiber, or overly processed, such as french fries, hamburgers, cookies, candies, and soda.  Drink enough fluid to keep your urine clear or pale yellow. General instructions   Exercise regularly or as told by your health care provider.  Go to the restroom when you have the urge to go. Do not hold it in.  Take over-the-counter and prescription medicines only as told by your health care provider. These include any fiber supplements.  Practice pelvic floor retraining exercises, such as deep breathing while relaxing the lower abdomen and pelvic floor relaxation during bowel movements.  Watch your condition for any changes.  Keep all follow-up visits as told by your health care provider. This is important. Contact a health care provider if:  You have pain that gets worse.  You have a fever.  You do not have a bowel movement after 4 days.  You vomit.  You are not hungry.  You lose weight.  You are bleeding from the anus.  You have thin, pencil-like stools. Get help right away if:  You have a fever and your symptoms suddenly get worse.  You leak stool or have blood in your stool.  Your abdomen is bloated.  You have severe pain in your abdomen.  You feel dizzy or you faint. This information is not intended to replace advice given to you by your health care provider. Make sure you discuss any questions you have with your health care  provider. Document Released: 07/23/2004 Document Revised: 05/14/2016 Document Reviewed: 04/14/2016 Elsevier Interactive Patient Education  2017 [employer]  We will set up GI referral for colonoscopy Set up physical exam.

## 2017-02-18 ENCOUNTER — Ambulatory Visit (INDEPENDENT_AMBULATORY_CARE_PROVIDER_SITE_OTHER): Payer: BLUE CROSS/BLUE SHIELD | Admitting: Family Medicine

## 2017-02-18 VITALS — BP 110/80 | HR 84 | Temp 98.5°F | Ht 69.0 in | Wt 276.4 lb

## 2017-02-18 DIAGNOSIS — R6881 Early satiety: Secondary | ICD-10-CM

## 2017-02-18 DIAGNOSIS — Z0000 Encounter for general adult medical examination without abnormal findings: Secondary | ICD-10-CM

## 2017-02-18 LAB — CBC WITH DIFFERENTIAL/PLATELET
Basophils Absolute: 0.1 10*3/uL (ref 0.0–0.1)
Basophils Relative: 0.7 % (ref 0.0–3.0)
EOS ABS: 0.1 10*3/uL (ref 0.0–0.7)
Eosinophils Relative: 1.7 % (ref 0.0–5.0)
HEMATOCRIT: 36.4 % (ref 36.0–46.0)
Hemoglobin: 12.1 g/dL (ref 12.0–15.0)
LYMPHS PCT: 18.8 % (ref 12.0–46.0)
Lymphs Abs: 1.4 10*3/uL (ref 0.7–4.0)
MCHC: 33.3 g/dL (ref 30.0–36.0)
MCV: 83.4 fl (ref 78.0–100.0)
MONOS PCT: 6.9 % (ref 3.0–12.0)
Monocytes Absolute: 0.5 10*3/uL (ref 0.1–1.0)
NEUTROS ABS: 5.4 10*3/uL (ref 1.4–7.7)
NEUTROS PCT: 71.9 % (ref 43.0–77.0)
PLATELETS: 256 10*3/uL (ref 150.0–400.0)
RBC: 4.36 Mil/uL (ref 3.87–5.11)
RDW: 14 % (ref 11.5–15.5)
WBC: 7.6 10*3/uL (ref 4.0–10.5)

## 2017-02-18 LAB — LIPID PANEL
CHOL/HDL RATIO: 4
Cholesterol: 162 mg/dL (ref 0–200)
HDL: 37 mg/dL — ABNORMAL LOW (ref >39.00)
LDL CALC: 105 mg/dL — AB (ref 0–99)
NonHDL: 125.02
TRIGLYCERIDES: 102 mg/dL (ref 0.0–149.0)
VLDL: 20.4 mg/dL (ref 0.0–40.0)

## 2017-02-18 LAB — BASIC METABOLIC PANEL
BUN: 9 mg/dL (ref 6–23)
CALCIUM: 9.1 mg/dL (ref 8.4–10.5)
CHLORIDE: 103 meq/L (ref 96–112)
CO2: 29 meq/L (ref 19–32)
CREATININE: 0.88 mg/dL (ref 0.40–1.20)
GFR: 71.96 mL/min (ref >60.00)
GLUCOSE: 98 mg/dL (ref 70–99)
Potassium: 4.2 mEq/L (ref 3.5–5.1)
Sodium: 139 mEq/L (ref 135–145)

## 2017-02-18 LAB — HEPATIC FUNCTION PANEL
ALK PHOS: 67 U/L (ref 39–117)
ALT: 21 U/L (ref 0–35)
AST: 22 U/L (ref 0–37)
Albumin: 4 g/dL (ref 3.5–5.2)
BILIRUBIN TOTAL: 0.5 mg/dL (ref 0.2–1.2)
Bilirubin, Direct: 0.1 mg/dL (ref 0.0–0.3)
Total Protein: 6.5 g/dL (ref 6.0–8.3)

## 2017-02-18 LAB — TSH: TSH: 3.68 u[IU]/mL (ref 0.35–4.50)

## 2017-02-18 NOTE — Progress Notes (Signed)
Pre visit review using our clinic review tool, if applicable. No additional management support is needed unless otherwise documented below in the visit note. 

## 2017-02-18 NOTE — Progress Notes (Signed)
Subjective:     Patient ID: Denise Booth, female   DOB: 06/21/1966, 51 y.o.   MRN: [id number]  HPI Patient seen for physical. She recently established care. She has history of morbid obesity, migraine headaches, GERD. She's never had baseline mammogram. We've already made referral for a repeat colonoscopy. She's had previous hysterectomy for benign disease some no indication for further Pap smears. She's had gradual weight gain over several years. Has previously lost significant weight with low carb diets. She has contemplated starting new eating plan. No consistent exercise.  Patient has never smoked. No alcohol use. Mom had several TIAs. No family history of type 2 diabetes or premature CAD.  Patient's had some persistent right upper quadrant pains. She states she has very early satiety and even occasional nausea without vomiting after eating. She has sensation of fullness after taking just a few bites. Previous cholecystectomy. Previously studied for celiac disease and negative.  Past Medical History:  Diagnosis Date  . Allergy   . Chicken pox   . GERD (gastroesophageal reflux disease)   . Migraine headache   . Thyroid disease   . Ulcer [facility])    Past Surgical History:  Procedure Laterality Date  . ABDOMINAL HYSTERECTOMY    . CHOLECYSTECTOMY    . TUBAL LIGATION      reports that she has never smoked. She has never used smokeless tobacco. She reports that she does not drink alcohol or use drugs. family history is not on file. Allergies  Allergen Reactions  . Codeine Sulfate Itching and Nausea And Vomiting  . Hydrocodone Itching and Nausea And Vomiting  . Pyridium [Phenazopyridine Hcl] Hives  . Tramadol Itching and Nausea And Vomiting  . Flagyl [Metronidazole Hcl] Rash  . Penicillins Rash  . Sulfa Antibiotics Rash     Review of Systems  Constitutional: Negative for activity change, appetite change, fatigue, fever and unexpected weight change.  HENT: Negative for ear  pain, hearing loss, sore throat and trouble swallowing.   Eyes: Negative for visual disturbance.  Respiratory: Negative for cough and shortness of breath.   Cardiovascular: Negative for chest pain and palpitations.  Gastrointestinal: Negative for blood in stool, constipation and diarrhea.  Endocrine: Negative for polydipsia and polyuria.  Genitourinary: Negative for dysuria and hematuria.  Musculoskeletal: Negative for arthralgias, back pain and myalgias.  Skin: Negative for rash.  Neurological: Negative for dizziness, syncope and headaches.  Hematological: Negative for adenopathy.  Psychiatric/Behavioral: Negative for confusion and dysphoric mood.       Objective:   Physical Exam  Constitutional: She is oriented to person, place, and time. She appears well-developed and well-nourished.  HENT:  Head: Normocephalic and atraumatic.  Eyes: EOM are normal. Pupils are equal, round, and reactive to light.  Neck: Normal range of motion. Neck supple. No thyromegaly present.  Cardiovascular: Normal rate, regular rhythm and normal heart sounds.   No murmur heard. Pulmonary/Chest: Breath sounds normal. No respiratory distress. She has no wheezes. She has no rales.  Abdominal: Soft. Bowel sounds are normal. She exhibits no distension and no mass. There is no tenderness. There is no rebound and no guarding.  Musculoskeletal: Normal range of motion. She exhibits no edema.  Lymphadenopathy:    She has no cervical adenopathy.  Neurological: She is alert and oriented to person, place, and time. She displays normal reflexes. No cranial nerve deficit.  Skin: No rash noted.  Psychiatric: She has a normal mood and affect. Her behavior is normal. Judgment and thought content normal.  Assessment:     Physical exam. Patient has never had baseline mammogram is due for repeat colonoscopy  Progressive early satiety and postprandial nausea/bloating.  No associated weight loss.    Plan:     -We  strongly advise weight loss and suggested calorie tracking device to help keep up.  We discussed some strategies for weight loss. -We made several specific recommendations day including elimination of sweet tea or other liquid calorie-containing beverages -Set up mammogram -Set up gastric emptying study to rule out gastroparesis -Colonoscopy referral has been made -Obtain screening lab work  Kristian Covey MD [facility] at [facility]

## 2017-02-18 NOTE — Patient Instructions (Signed)
Low-Gluten Eating Plan Gluten is a protein that is found in wheat, barley, rye, and some other grains. Some people have a condition that makes them unable to digest gluten. For those people, eating just a small amount of gluten can damage their intestines. This is not a gluten-free eating plan. This low-gluten eating plan is for people who feel better when they eat less gluten. What do I need to know about this eating plan?  You can eat anything that does not contain wheat or other grains that have gluten.  You can eat anything that is labeled "gluten-free."  Make sure to read food labels.  Eat a variety of foods so you get all of the nutrients that you need.  Avoid processed foods and sauces because many of them contain wheat. To have more control over the ingredients in your meals, consider making food yourself instead of buying prepared foods. What foods can I eat? Grains  Rice. Bulgur. Quinoa. Corn. Buckwheat. Amaranth. Corn tortillas or taco shells. Oatmeal that is labeled as "gluten free" or "uncontaminated." Vegetables  Lettuce. Spinach. Peas. Beets. Cauliflower. Cabbage. Broccoli. Carrots. Tomatoes. Squash. Eggplant. Herbs. Peppers. Onions. Cucumbers. Brussels sprouts. Yams and sweet potatoes. Beans. Lentils. Fruits  Bananas. Apples. Oranges. Grapes. Papaya. Mango. Pomegranate. Kiwi. Grapefruit. Cherries. Meats and Other Protein Sources  Beef. Pork. Chicken. [place]. Fish. Eggs. Tofu. Beans. Nuts. Lentils. Dairy  Milk. Ice cream. Yogurt. Cheese. Cottage cheese. Beverages  Water. Coffee. Tea. Juice. Soda. Seltzer water. Condiments  Mustard. Relish. Low-fat, low-sugar ketchup. Low-fat, low-sugar barbecue sauce. Vinegar. Low-fat or fat-free mayonnaise. Sweets and Desserts  Honey. Sugar. Maple syrup. Fats and Oils  Butter. Vegetable oil. Olive oil. Canola oil. Walnut oil. Other  Arrowroot or cornstarch. Potato flour. The items listed above may not be a complete list of  recommended foods or beverages. Contact your dietitian for more options.  What foods are not recommended? Grains  Wheat. Barley. Rye. Oatmeal. Meat and Other Protein Sources  Seitan. Cold cuts. Hotdogs. Salami. Sausages. Beverages  Beer. Condiments  Malt vinegar. Salad dressing. Soy sauce. Sweets and Desserts  Licorice. Brown rice syrup. Pre-made pudding or pudding mixes. Other  Bouillon cubes. Canned or boxed pre-made soups or soup packets. Bagged chips, such as potato chips and tortilla chips. Seasoning packets. The items listed above may not be a complete list of foods and beverages to avoid. Contact your dietitian for more information.  This information is not intended to replace advice given to you by your health care provider. Make sure you discuss any questions you have with your health care provider. Document Released: 03/11/2015 Document Revised: 04/01/2016 Document Reviewed: 11/20/2014 Elsevier Interactive Patient Education  2017 [employer].  Consider calorie tracking device such as Myfitnesspal Set up mammogram. We will set up gastric emptying scan.

## 2017-02-26 ENCOUNTER — Ambulatory Visit (INDEPENDENT_AMBULATORY_CARE_PROVIDER_SITE_OTHER): Payer: BLUE CROSS/BLUE SHIELD | Admitting: Internal Medicine

## 2017-02-26 ENCOUNTER — Encounter: Payer: Self-pay | Admitting: Internal Medicine

## 2017-02-26 VITALS — BP 124/84 | HR 87 | Temp 97.9°F | Wt 268.8 lb

## 2017-02-26 DIAGNOSIS — K529 Noninfective gastroenteritis and colitis, unspecified: Secondary | ICD-10-CM | POA: Insufficient documentation

## 2017-02-26 NOTE — Assessment & Plan Note (Signed)
Seems to have had an acute illness from grandson--but something was going on preceding this Asked her to take the dexilant regularly---just in case She has promethazine at home--she can use prn Try mostly liquids for now Discussed adding probiotic Reassuring lab work--so doubt something like choledocholithiasis She will check in with Dr Caryl Never if symptoms persist on Monday

## 2017-02-26 NOTE — Progress Notes (Signed)
   Subjective:    Patient ID: Denise Booth, female    DOB: [birth year], 51 y.o.   MRN: [id number]  HPI Here due to worsened abdominal symptoms  Diarrhea started 4 days ago Watching grandson and he has been sick (2 days before her) Diarrhea eased up some Temp up to 100.7 yesterday Eating very little--- but then cramping last night after having 1/2 baked potato Nausea but no vomiting RUQ pain also--this has persisted Greenish, watery stool--- 5 during last night, and 3 more today No blood in the stool (other than on toilet paper from her hemorrhoids)  Current Outpatient Prescriptions on File Prior to Visit  Medication Sig Dispense Refill  . DEXILANT 60 MG capsule Take 1 capsule by mouth daily.  2  . levothyroxine (SYNTHROID, LEVOTHROID) 50 MCG tablet Take 50 mcg by mouth daily.    . montelukast (SINGULAIR) 10 MG tablet Take 10 mg by mouth at bedtime.    . rizatriptan (MAXALT) 10 MG tablet 10 mg.  5   No current facility-administered medications on file prior to visit.     Allergies  Allergen Reactions  . Codeine Sulfate Itching and Nausea And Vomiting  . Hydrocodone Itching and Nausea And Vomiting  . Pyridium [Phenazopyridine Hcl] Hives  . Tramadol Itching and Nausea And Vomiting  . Flagyl [Metronidazole Hcl] Rash  . Penicillins Rash  . Sulfa Antibiotics Rash    Past Medical History:  Diagnosis Date  . Allergy   . Chicken pox   . GERD (gastroesophageal reflux disease)   . Migraine headache   . Thyroid disease   . Ulcer     Past Surgical History:  Procedure Laterality Date  . ABDOMINAL HYSTERECTOMY    . CHOLECYSTECTOMY    . TUBAL LIGATION      No family history on file.  Social History   Social History  . Marital status: Married    Spouse name: N/A  . Number of children: N/A  . Years of education: N/A   Occupational History  . Not on file.   Social History Main Topics  . Smoking status: Never Smoker  . Smokeless tobacco: Never Used  . Alcohol  use No  . Drug use: No  . Sexual activity: Not on file   Other Topics Concern  . Not on file   Social History Narrative  . No narrative on file   Review of Systems  No cough No SOB No dysuria, urgency or hematuria No recent travel     Objective:   Physical Exam  Constitutional: No distress.  Neck: No thyromegaly present.  Pulmonary/Chest: Effort normal and breath sounds normal. No respiratory distress. She has no wheezes. She has no rales.  Abdominal: Soft. She exhibits no mass. There is no rebound and no guarding.  BS mostly absent Mild generalized tenderness--but most noticeable over stomach  Lymphadenopathy:    She has no cervical adenopathy.          Assessment & Plan:

## 2017-03-02 ENCOUNTER — Ambulatory Visit (HOSPITAL_COMMUNITY): Payer: BLUE CROSS/BLUE SHIELD

## 2017-03-02 ENCOUNTER — Encounter: Payer: Self-pay | Admitting: Family Medicine

## 2017-03-02 ENCOUNTER — Telehealth: Payer: Self-pay | Admitting: Family Medicine

## 2017-03-02 NOTE — Telephone Encounter (Signed)
Patient is still having upper abdominal pain.  She states that "it feels like something is in there".  She also has bloating.  Okay to order ultrasound?

## 2017-03-02 NOTE — Telephone Encounter (Signed)
Patient has declined the gastric empty study and will call GI to see if she can get an appointment.

## 2017-03-02 NOTE — Telephone Encounter (Signed)
° ° ° °  Pt went to [facility] over the weekend and was told not to go thru with the gastric empty study that he suggested she have an Ultra Sound . Pt cancel the gastric study that was scheduled for this morning. Would like a call back about having an Ultra Sound done

## 2017-03-02 NOTE — Telephone Encounter (Signed)
No.  We elected not to get ultrasound since she's had prior cholecystectomy years ago- and the symptoms of early satiety and bloating after eating.  Would proceed with study ordered.  If this is unrevealing will consider GI consult.

## 2017-03-10 ENCOUNTER — Encounter: Payer: Self-pay | Admitting: Gastroenterology

## 2017-03-17 ENCOUNTER — Encounter: Payer: BLUE CROSS/BLUE SHIELD | Admitting: Gastroenterology

## 2017-03-17 ENCOUNTER — Ambulatory Visit (INDEPENDENT_AMBULATORY_CARE_PROVIDER_SITE_OTHER): Payer: BLUE CROSS/BLUE SHIELD | Admitting: Gastroenterology

## 2017-03-17 ENCOUNTER — Encounter: Payer: Self-pay | Admitting: Gastroenterology

## 2017-03-17 VITALS — BP 124/72 | HR 70 | Ht 68.0 in | Wt 274.0 lb

## 2017-03-17 DIAGNOSIS — R140 Abdominal distension (gaseous): Secondary | ICD-10-CM | POA: Diagnosis not present

## 2017-03-17 DIAGNOSIS — R142 Eructation: Secondary | ICD-10-CM

## 2017-03-17 DIAGNOSIS — R110 Nausea: Secondary | ICD-10-CM | POA: Diagnosis not present

## 2017-03-17 DIAGNOSIS — R1013 Epigastric pain: Secondary | ICD-10-CM

## 2017-03-17 NOTE — Patient Instructions (Signed)
You have been scheduled for an endoscopy. Please follow written instructions given to you at your visit today. If you use inhalers (even only as needed), please bring them with you on the day of your procedure. Your physician has requested that you go to www.startemmi.com and enter the access code given to you at your visit today. This web site gives a general overview about your procedure. However, you should still follow specific instructions given to you by our office regarding your preparation for the procedure.  Begin Benefiber or Citrucel daily.   You have been scheduled for a gastric emptying scan at [facility] on _____ at _____. Please arrive at least 15 minutes prior to your appointment for registration. Please make certain not to have anything to eat or drink after midnight the night before your test. Hold all stomach medications (ex: Zofran, phenergan, Reglan) 48 hours prior to your test. If you need to reschedule your appointment, please contact radiology scheduling at [phone]. _____________________________________________________________________ A gastric-emptying study measures how long it takes for food to move through your stomach. There are several ways to measure stomach emptying. In the most common test, you eat food that contains a small amount of radioactive material. A scanner that detects the movement of the radioactive material is placed over your abdomen to monitor the rate at which food leaves your stomach. This test normally takes about 4 hours to complete. _____________________________________________________________________

## 2017-03-17 NOTE — Progress Notes (Signed)
03/17/2017 Denise HuskyGina Reid Booth [id number] 18-Apr-1966   HISTORY OF PRESENT ILLNESS:  This is a pleasant 51 year old female who is new to our practice. [place] in [place] or more years ago. Says that she had a colonoscopy there about 10 years ago prior to having her hysterectomy and believes that the study was normal at that time. She also reports a remote history of H. pylori, for which she was treated years ago. Also has listed in her history that she's had ulcers. Patient is unsure of this diagnosis as well as being unsure whether she ever had an EGD, upper GI, etc.  Anyway, she is here today at the request of her PCP, Dr. Caryl NeverBurchette, for evaluation regarding some upper GI symptoms.  She tells me that for the past 2 months she has been having epigastric and right upper quadrant abdominal pain that sometimes can radiate around the right side to her back. She is status post cholecystectomy. She also complains of excessive belching, bloating, nausea, and sensation of fullness. She says that when she eats dinner, even if it is something very light, then the next morning she will be belching and it will taste like what she had for dinner the night before. She denies any vomiting. She says that at times the pain is very severe, but is always present constantly with some degree of discomfort. Pain worsens after eating. She denies any significant heartburn or reflux symptoms. She had not been taking any acid medication, but since seeing her PCP she was started on Dexilant 60 mg daily. She's been on that for about 2 weeks and has not noticed much improvement in her symptoms since that time.  She is not diabetic.  CBC, BMP, TSH, LFT's WNL's.  She also mentions that she does have some constipation, but seems to alternate with diarrhea particularly when she is anxious or stressed. Has not used anything on a regular basis for this issue. Has taken Metamucil on occasion.   Past Medical  History:  Diagnosis Date  . Allergy   . Chicken pox   . GERD (gastroesophageal reflux disease)   . Migraine headache   . Thyroid disease   . Ulcer    Past Surgical History:  Procedure Laterality Date  . ABDOMINAL HYSTERECTOMY    . CHOLECYSTECTOMY    . TUBAL LIGATION      reports that she has never smoked. She has never used smokeless tobacco. She reports that she does not drink alcohol or use drugs. family history is not on file. Allergies  Allergen Reactions  . Codeine Sulfate Itching and Nausea And Vomiting  . Hydrocodone Itching and Nausea And Vomiting  . Pyridium [Phenazopyridine Hcl] Hives  . Tramadol Itching and Nausea And Vomiting  . Flagyl [Metronidazole Hcl] Rash  . Penicillins Rash  . Sulfa Antibiotics Rash      Outpatient Encounter Prescriptions as of 03/17/2017  Medication Sig  . DEXILANT 60 MG capsule Take 1 capsule by mouth daily.  Marland Kitchen. levothyroxine (SYNTHROID, LEVOTHROID) 50 MCG tablet Take 50 mcg by mouth daily.  . montelukast (SINGULAIR) 10 MG tablet Take 10 mg by mouth at bedtime.  . rizatriptan (MAXALT) 10 MG tablet 10 mg.   No facility-administered encounter medications on file as of 03/17/2017.      REVIEW OF SYSTEMS  : All other systems reviewed and negative except where noted in the History of Present Illness.   PHYSICAL EXAM: BP 124/72 (BP Location: Right Arm, Patient  Position: Sitting, Cuff Size: Large)   Pulse 70   Ht 5\' 8"  (1.727 m)   Wt 274 lb (124.3 kg)   BMI 41.66 kg/m  General: Well developed white female in no acute distress Head: Normocephalic and atraumatic Eyes:  Sclerae anicteric, conjunctiva pink. Ears: Normal auditory acuity Lungs: Clear throughout to auscultation; no increased WOB. Heart: Regular rate and rhythm Abdomen: Soft, non-distended. Normal bowel sounds.  Epigastric and RUQ TTP. Musculoskeletal: Symmetrical with no gross deformities  Skin: No lesions on visible extremities Extremities: No edema  Neurological: Alert  oriented x 4, grossly non-focal Psychological:  Alert and cooperative. Normal mood and affect  ASSESSMENT AND PLAN: -Epigastric/RUQ abdominal pain with nausea, belching, bloating, sensation of fullness:  No significant reflux sensation.  She has actually been added on for EGD later today with Dr. Adela Lank to rule out ulcer, etc.  I think that GES is appropriate then as well pending results of EGD.  If all of this is negative and symptoms persist despite treatment with PPI then may need cross-sectional imaging with CT. -Screening colonoscopy:  Patient will have this performed later this year.  Wants to get her symptoms figured out first. -Constipation:  Sounds like it alternates with diarrhea, which she has when she is anxious and stressed.  Likely has IBS.  Has not used anything regularly.  Will start by trying a daily fiber supplement such as Benefiber or Citrucel.  *The risks, benefits, and alternatives to EGD were discussed with the patient and she consents to proceed.    CC:  Kristian Covey, MD

## 2017-03-17 NOTE — Progress Notes (Signed)
Agree with assessment and plan. Patient was scheduled for EGD and then cancelled, she will reschedule and will await results with further recommendations.

## 2017-03-23 ENCOUNTER — Ambulatory Visit (AMBULATORY_SURGERY_CENTER): Payer: BLUE CROSS/BLUE SHIELD | Admitting: Gastroenterology

## 2017-03-23 ENCOUNTER — Encounter: Payer: Self-pay | Admitting: Gastroenterology

## 2017-03-23 VITALS — BP 101/65 | HR 51 | Temp 98.0°F | Resp 15 | Ht 68.0 in | Wt 274.0 lb

## 2017-03-23 DIAGNOSIS — R1013 Epigastric pain: Secondary | ICD-10-CM

## 2017-03-23 DIAGNOSIS — R6881 Early satiety: Secondary | ICD-10-CM

## 2017-03-23 DIAGNOSIS — K219 Gastro-esophageal reflux disease without esophagitis: Secondary | ICD-10-CM | POA: Diagnosis not present

## 2017-03-23 DIAGNOSIS — B9681 Helicobacter pylori [H. pylori] as the cause of diseases classified elsewhere: Secondary | ICD-10-CM | POA: Diagnosis not present

## 2017-03-23 DIAGNOSIS — K2950 Unspecified chronic gastritis without bleeding: Secondary | ICD-10-CM | POA: Diagnosis not present

## 2017-03-23 MED ORDER — SODIUM CHLORIDE 0.9 % IV SOLN: 500.0000 mL | INTRAVENOUS | Status: DC

## 2017-03-23 MED ORDER — OMEPRAZOLE 40 MG PO CPDR: 40.0000 mg | DELAYED_RELEASE_CAPSULE | Freq: Every day | ORAL | 3 refills | Status: DC

## 2017-03-23 NOTE — Progress Notes (Signed)
Pt's states no medical or surgical changes since previsit or office visit. 

## 2017-03-23 NOTE — Op Note (Signed)
[facility] Patient Name: Denise Booth Procedure Date: 03/23/2017 7:04 AM MRN: [id number] Endoscopist: Viviann Spare P. Armbruster MD, MD Age: 51 Referring MD:  Date of Birth: 10/21/1966 Gender: Female Account #: [id number] Procedure:                Upper GI endoscopy Indications:              Epigastric abdominal pain, Heartburn, Early                            satiety. On Dexilant but recently ran out, it did                            not relieve her symptoms other than control                            heartburn Medicines:                Monitored Anesthesia Care Procedure:                Pre-Anesthesia Assessment:                           - Prior to the procedure, a History and Physical                            was performed, and patient medications and                            allergies were reviewed. The patient's tolerance of                            previous anesthesia was also reviewed. The risks                            and benefits of the procedure and the sedation                            options and risks were discussed with the patient.                            All questions were answered, and informed consent                            was obtained. Prior Anticoagulants: The patient has                            taken no previous anticoagulant or antiplatelet                            agents. ASA Grade Assessment: III - A patient with                            severe systemic disease. After reviewing the risks  and benefits, the patient was deemed in                            satisfactory condition to undergo the procedure.                           After obtaining informed consent, the endoscope was                            passed under direct vision. Throughout the                            procedure, the patient's blood pressure, pulse, and                            oxygen saturations were monitored  continuously. The                            Endoscope was introduced through the mouth, and                            advanced to the second part of duodenum. The upper                            GI endoscopy was accomplished without difficulty.                            The patient tolerated the procedure well. Scope In: Scope Out: Findings:                 Esophagogastric landmarks were identified: the                            Z-line was found at 38 cm, the gastroesophageal                            junction was found at 38 cm and the upper extent of                            the gastric folds was found at 38 cm from the                            incisors.                           The exam of the esophagus was otherwise normal.                           Diffuse mildly erythematous mucosa was found in the                            gastric fundus and in the gastric body. Biopsies  were taken with a cold forceps from the antrum and                            body for Helicobacter pylori testing.                           There was a small amount of residual food in the                            stomach. The exam of the stomach was otherwise                            normal.                           The duodenal bulb and second portion of the                            duodenum were normal. Complications:            No immediate complications. Estimated blood loss:                            Minimal. Estimated Blood Loss:     Estimated blood loss was minimal. Impression:               - Esophagogastric landmarks identified.                           - Normal esophagus                           - Erythematous mucosa in the gastric fundus and                            gastric body. Biopsied.                           - Small amount of retained food in the stomach                           - Normal stomach otherwise                           - Normal  duodenal bulb and second portion of the                            duodenum. Recommendation:           - Patient has a contact number available for                            emergencies. The signs and symptoms of potential                            delayed complications were discussed with the  patient. Return to normal activities tomorrow.                            Written discharge instructions were provided to the                            patient.                           - Resume previous diet.                           - Continue present medications.                           - Start omeprazole 40mg  once daily for heartburn                            (previously controlled on Dexilant)                           - Await pathology results.                           - Proceed with gastric emptying study as scheduled,                            rule out gastroparesis Willaim Rayas. Armbruster MD, MD 03/23/2017 7:49:34 AM This report has been signed electronically.

## 2017-03-23 NOTE — Progress Notes (Signed)
Called to room to assist during endoscopic procedure.  Patient ID and intended procedure confirmed with present staff. Received instructions for my participation in the procedure from the performing physician.  

## 2017-03-23 NOTE — Patient Instructions (Signed)
Discharge instructions given. Biopsies taken. Resume previous medications. Prescription sent into pharmacy. YOU HAD AN ENDOSCOPIC PROCEDURE TODAY AT THE [facility]:   Refer to the procedure report that was given to you for any specific questions about what was found during the examination.  If the procedure report does not answer your questions, please call your gastroenterologist to clarify.  If you requested that your care partner not be given the details of your procedure findings, then the procedure report has been included in a sealed envelope for you to review at your convenience later.  YOU SHOULD EXPECT: Some feelings of bloating in the abdomen. Passage of more gas than usual.  Walking can help get rid of the air that was put into your GI tract during the procedure and reduce the bloating. If you had a lower endoscopy (such as a colonoscopy or flexible sigmoidoscopy) you may notice spotting of blood in your stool or on the toilet paper. If you underwent a bowel prep for your procedure, you may not have a normal bowel movement for a few days.  Please Note:  You might notice some irritation and congestion in your nose or some drainage.  This is from the oxygen used during your procedure.  There is no need for concern and it should clear up in a day or so.  SYMPTOMS TO REPORT IMMEDIATELY:    Following upper endoscopy (EGD)  Vomiting of blood or coffee ground material  New chest pain or pain under the shoulder blades  Painful or persistently difficult swallowing  New shortness of breath  Fever of 100F or higher  Black, tarry-looking stools  For urgent or emergent issues, a gastroenterologist can be reached at any hour by calling [phone].   DIET:  We do recommend a small meal at first, but then you may proceed to your regular diet.  Drink plenty of fluids but you should avoid alcoholic beverages for 24 hours.  ACTIVITY:  You should plan to take it easy for the rest  of today and you should NOT DRIVE or use heavy machinery until tomorrow (because of the sedation medicines used during the test).    FOLLOW UP: Our staff will call the number listed on your records the next business day following your procedure to check on you and address any questions or concerns that you may have regarding the information given to you following your procedure. If we do not reach you, we will leave a message.  However, if you are feeling well and you are not experiencing any problems, there is no need to return our call.  We will assume that you have returned to your regular daily activities without incident.  If any biopsies were taken you will be contacted by phone or by letter within the next 1-3 weeks.  Please call us at [phone] if you have not heard about the biopsies in 3 weeks.    SIGNATURES/CONFIDENTIALITY: You and/or your care partner have signed paperwork which will be entered into your electronic medical record.  These signatures attest to the fact that that the information above on your After Visit Summary has been reviewed and is understood.  Full responsibility of the confidentiality of this discharge information lies with you and/or your care-partner.

## 2017-03-23 NOTE — Progress Notes (Signed)
Dental advisory given to patient 

## 2017-03-23 NOTE — Progress Notes (Signed)
A/ox3 pleased with MAC, report to Celia RN 

## 2017-03-24 ENCOUNTER — Telehealth: Payer: Self-pay

## 2017-03-24 ENCOUNTER — Telehealth: Payer: Self-pay | Admitting: *Deleted

## 2017-03-24 NOTE — Telephone Encounter (Signed)
  Follow up Call-  Call back number 03/23/2017  Post procedure Call Back phone  # [phone]  Permission to leave phone message Yes  Some recent data might be hidden     Patient questions:  Do you have a fever, pain , or abdominal swelling? No. Pain Score  0 *  Have you tolerated food without any problems? Yes.    Have you been able to return to your normal activities? Yes.    Do you have any questions about your discharge instructions: Diet   No. Medications  No. Follow up visit  No.  Do you have questions or concerns about your Care? No.  Actions: * If pain score is 4 or above: No action needed, pain <4.

## 2017-03-24 NOTE — Telephone Encounter (Signed)
  Follow up Call-  Call back number 03/23/2017  Post procedure Call Back phone  # [phone]  Permission to leave phone message Yes  Some recent data might be hidden     Left message

## 2017-04-01 ENCOUNTER — Ambulatory Visit (HOSPITAL_COMMUNITY): Admission: RE | Admit: 2017-04-01 | Payer: BLUE CROSS/BLUE SHIELD | Source: Ambulatory Visit

## 2017-04-05 ENCOUNTER — Telehealth: Payer: Self-pay | Admitting: Gastroenterology

## 2017-04-05 ENCOUNTER — Other Ambulatory Visit: Payer: Self-pay

## 2017-04-05 DIAGNOSIS — A048 Other specified bacterial intestinal infections: Secondary | ICD-10-CM

## 2017-04-07 ENCOUNTER — Telehealth: Payer: Self-pay

## 2017-04-07 ENCOUNTER — Other Ambulatory Visit: Payer: Self-pay | Admitting: Gastroenterology

## 2017-04-07 NOTE — Telephone Encounter (Signed)
Called infectious disease, they have received referral and chart is under review. They said that if MD approves they will contact patient to schedule. If they will not be able to see patient they will contact our office. Left message for patient to let her know to expect a call re: appointment.

## 2017-04-07 NOTE — Telephone Encounter (Signed)
Looks like Omeprazole was sent in to pts pharmacy instead of Dexilant. Do you want her to change over to the Omeprazole?

## 2017-04-07 NOTE — Telephone Encounter (Signed)
Left message to return call 

## 2017-04-07 NOTE — Telephone Encounter (Signed)
Last I spoke with her we discussed escalating omeprazole to BID rather than going straight to Dexilant. If she has tried this was can give her Dexilant if symptoms persist, or if that is her preference it is okay. Dexilant 60mg  po q day. Thanks

## 2017-04-13 NOTE — Telephone Encounter (Signed)
Left message for pt to return call.

## 2017-04-14 ENCOUNTER — Other Ambulatory Visit: Payer: Self-pay

## 2017-04-14 ENCOUNTER — Other Ambulatory Visit: Payer: Self-pay | Admitting: Gastroenterology

## 2017-04-14 MED ORDER — DEXILANT 60 MG PO CPDR: 1.0000 | DELAYED_RELEASE_CAPSULE | Freq: Every day | ORAL | 2 refills | Status: DC

## 2017-04-14 NOTE — Telephone Encounter (Signed)
Do you want to send in refill of Dexilant? In Endo report I think you wanted pt to be on Omeprazole instead?

## 2017-04-14 NOTE — Telephone Encounter (Signed)
Dexilant refilled. Sent to [facility].

## 2017-04-14 NOTE — Telephone Encounter (Signed)
Pharmacy now calling stating patient wants Dexilant sent in and that Dr.Armbruster told her she could take it instead of omeprazole.

## 2017-04-15 ENCOUNTER — Other Ambulatory Visit: Payer: Self-pay

## 2017-04-15 NOTE — Telephone Encounter (Signed)
I have spoken with her at length about this in the past. I had recommended trying higher dose omeprazole (40mg  BID) initially but she preferred to have Dexilant as her insurance covered it. If she wishes to have Dexilant instead you can place the script at 60mg  once daily #30, RF3. If it is not covered, she should take omeprazole 40mg  BID. Thanks

## 2017-04-15 NOTE — Telephone Encounter (Signed)
Rx sent on 04/14/17

## 2017-11-10 ENCOUNTER — Ambulatory Visit: Payer: Self-pay | Admitting: Adult Health

## 2017-11-10 DIAGNOSIS — Z0289 Encounter for other administrative examinations: Secondary | ICD-10-CM

## 2017-11-15 ENCOUNTER — Ambulatory Visit: Payer: Self-pay | Admitting: Family Medicine

## 2019-08-08 ENCOUNTER — Other Ambulatory Visit: Payer: Self-pay

## 2019-08-08 DIAGNOSIS — Z20822 Contact with and (suspected) exposure to covid-19: Secondary | ICD-10-CM

## 2019-08-09 LAB — NOVEL CORONAVIRUS, NAA: SARS-CoV-2, NAA: NOT DETECTED

## 2019-08-22 ENCOUNTER — Other Ambulatory Visit: Payer: Self-pay

## 2019-08-22 ENCOUNTER — Emergency Department (HOSPITAL_COMMUNITY): Payer: Commercial Managed Care - PPO

## 2019-08-22 ENCOUNTER — Emergency Department (HOSPITAL_COMMUNITY)
Admission: EM | Admit: 2019-08-22 | Discharge: 2019-08-22 | Disposition: A | Discharge status: Home / Self Care | Payer: Commercial Managed Care - PPO | Attending: Emergency Medicine | Admitting: Emergency Medicine

## 2019-08-22 ENCOUNTER — Encounter (HOSPITAL_COMMUNITY): Payer: Self-pay | Admitting: Emergency Medicine

## 2019-08-22 DIAGNOSIS — K219 Gastro-esophageal reflux disease without esophagitis: Secondary | ICD-10-CM

## 2019-08-22 DIAGNOSIS — R0789 Other chest pain: Secondary | ICD-10-CM

## 2019-08-22 DIAGNOSIS — Z79899 Other long term (current) drug therapy: Secondary | ICD-10-CM | POA: Diagnosis not present

## 2019-08-22 DIAGNOSIS — Z20828 Contact with and (suspected) exposure to other viral communicable diseases: Secondary | ICD-10-CM | POA: Insufficient documentation

## 2019-08-22 LAB — HEPATIC FUNCTION PANEL
ALT: 23 U/L (ref 0–44)
AST: 25 U/L (ref 15–41)
Albumin: 3.6 g/dL (ref 3.5–5.0)
Alkaline Phosphatase: 68 U/L (ref 38–126)
Bilirubin, Direct: 0.1 mg/dL (ref 0.0–0.2)
Indirect Bilirubin: 0.6 mg/dL (ref 0.3–0.9)
Total Bilirubin: 0.7 mg/dL (ref 0.3–1.2)
Total Protein: 6.5 g/dL (ref 6.5–8.1)

## 2019-08-22 LAB — LIPASE, BLOOD: Lipase: 24 U/L (ref 11–51)

## 2019-08-22 LAB — BASIC METABOLIC PANEL
Anion gap: 11 (ref 5–15)
BUN: 5 mg/dL — ABNORMAL LOW (ref 6–20)
CO2: 22 mmol/L (ref 22–32)
Calcium: 9.2 mg/dL (ref 8.9–10.3)
Chloride: 105 mmol/L (ref 98–111)
Creatinine, Ser: 0.9 mg/dL (ref 0.44–1.00)
GFR calc Af Amer: >60 mL/min (ref >60)
GFR calc non Af Amer: >60 mL/min (ref >60)
Glucose, Bld: 93 mg/dL (ref 70–99)
Potassium: 3.8 mmol/L (ref 3.5–5.1)
Sodium: 138 mmol/L (ref 135–145)

## 2019-08-22 LAB — SARS CORONAVIRUS 2 (TAT 6-24 HRS): SARS Coronavirus 2: NEGATIVE

## 2019-08-22 LAB — TROPONIN I (HIGH SENSITIVITY)
Troponin I (High Sensitivity): 2 ng/L (ref <18)
Troponin I (High Sensitivity): 3 ng/L (ref <18)

## 2019-08-22 LAB — CBC
HCT: 36.7 % (ref 36.0–46.0)
Hemoglobin: 12.5 g/dL (ref 12.0–15.0)
MCH: 29.1 pg (ref 26.0–34.0)
MCHC: 34.1 g/dL (ref 30.0–36.0)
MCV: 85.3 fL (ref 80.0–100.0)
Platelets: 231 10*3/uL (ref 150–400)
RBC: 4.3 MIL/uL (ref 3.87–5.11)
RDW: 13.2 % (ref 11.5–15.5)
WBC: 7.2 10*3/uL (ref 4.0–10.5)
nRBC: 0 % (ref 0.0–0.2)

## 2019-08-22 MED ORDER — DEXILANT 30 MG PO CPDR: 60.0000 mg | DELAYED_RELEASE_CAPSULE | Freq: Every day | ORAL | 0 refills | Status: DC

## 2019-08-22 MED ORDER — PANTOPRAZOLE SODIUM 40 MG PO TBEC
40.0000 mg | DELAYED_RELEASE_TABLET | Freq: Once | ORAL | Status: AC
  Administered 2019-08-22: 14:00:00 40 mg via ORAL
  Filled 2019-08-22: qty 1

## 2019-08-22 MED ORDER — KETOROLAC TROMETHAMINE 30 MG/ML IJ SOLN
30.0000 mg | Freq: Once | INTRAMUSCULAR | Status: AC
  Administered 2019-08-22: 30 mg via INTRAMUSCULAR
  Filled 2019-08-22: qty 1

## 2019-08-22 MED ORDER — SODIUM CHLORIDE 0.9% FLUSH: 3.0000 mL | Freq: Once | INTRAVENOUS | Status: DC

## 2019-08-22 MED ORDER — NAPROXEN 500 MG PO TABS: 500.0000 mg | ORAL_TABLET | Freq: Two times a day (BID) | ORAL | 0 refills | Status: AC

## 2019-08-22 NOTE — ED Triage Notes (Signed)
Chest pain- short of breath-- has been quarantined for 14 days-- husband was positive for covid -- has since re tested negative-- pt tested negative --  Has chest discomfort left sided, radiating to left shoulder-  Has not had a fever-no sore throat, no other symptoms of covid

## 2019-08-22 NOTE — ED Provider Notes (Signed)
[facility] EMERGENCY DEPARTMENT Provider Note   CSN: [id number] Arrival date & time: 08/22/19  0901     History   Chief Complaint Chief Complaint  Patient presents with   Chest Pain    HPI Denise Booth is a 53 y.o. female.     HPI  Pt is a 53 y/o female with a h/o GERD, thyroid disease who presents to the ED c/o left sided chest pain that began last week, but seemed to worsened yesterday. Pain started while she was sitting on the couch. Pain worse when laying down or pushing up with her left arm. Pain is sharp. Pain has been constant but has improved since onset. She has tried tylenol and biofreeze without improvement. Pain is not worse with exertion. She reports she has had some epigastric abd pain for several months. The pain seems worse a few hours after eating. She does not have pain currently.She notes that she used to be on Dexilant for acid reflux but she ran out about a week ago.  Denies SOB, fevers, chills, NVD, urinary sxs. She has some BLE swelling that she states is chronic and unchanged.  Denies leg pain/swelling, hemoptysis, recent surgery/trauma, recent long travel, personal hx of cancer, or hx of DVT/PE. She had been on intravaginal estrogen but she has not used it in a month.  Husband recently tested positve for COVID, but she tested negative.  Past Medical History:  Diagnosis Date   Allergy    Chicken pox    GERD (gastroesophageal reflux disease)    Migraine headache    Thyroid disease    Ulcer     Patient Active Problem List   Diagnosis Date Noted   Abdominal pain, epigastric 03/17/2017   Belching 03/17/2017   Bloating 03/17/2017   Nausea without vomiting 03/17/2017   Gastroenteritis 02/26/2017   Morbid obesity ([place]) 02/16/2017   GERD (gastroesophageal reflux disease) 02/16/2017   Migraines 02/16/2017    Past Surgical History:  Procedure Laterality Date   ABDOMINAL HYSTERECTOMY     CHOLECYSTECTOMY      TUBAL LIGATION       OB History   No obstetric history on file.      Home Medications    Prior to Admission medications   Medication Sig Start Date End Date Taking? Authorizing Provider  acetaminophen (TYLENOL) 500 MG tablet Take 1,000 mg by mouth every 6 (six) hours as needed for moderate pain.   Yes [provider]  Cholecalciferol (VITAMIN D3 PO) Take 1 tablet by mouth daily.   Yes [provider]  levothyroxine (SYNTHROID, LEVOTHROID) 50 MCG tablet Take 50 mcg by mouth daily.   Yes [provider]  loratadine (CLARITIN) 10 MG tablet Take 10 mg by mouth daily as needed for allergies.   Yes [provider]  Multiple Vitamins-Minerals (ZINC PO) Take 1 tablet by mouth daily.   Yes [provider]  topiramate (TOPAMAX) 25 MG tablet Take 25 mg by mouth as needed (migraines).   Yes [provider]  Dexlansoprazole (DEXILANT) 30 MG capsule Take 2 capsules (60 mg total) by mouth daily. 08/22/19   Darick Fetters S, PA-C  naproxen (NAPROSYN) 500 MG tablet Take 1 tablet (500 mg total) by mouth 2 (two) times daily for 7 days. 08/22/19 08/29/19  Krimson Massmann S, PA-C  omeprazole (PRILOSEC) 40 MG capsule Take 1 capsule (40 mg total) by mouth daily. Patient not taking: Reported on 08/22/2019 03/23/17   Armbruster, Carlota Raspberry, MD  Family History No family history on file.  Social History Social History   Tobacco Use   Smoking status: Never Smoker   Smokeless tobacco: Never Used  Substance Use Topics   Alcohol use: No   Drug use: No     Allergies   Clarithromycin, Codeine sulfate, Hydrocodone, Pyridium [phenazopyridine hcl], Tomato, Tramadol, Ceftin [cefuroxime axetil], Flagyl [metronidazole hcl], Penicillins, and Sulfa antibiotics   Review of Systems Review of Systems  Constitutional: Negative for chills and fever.  HENT: Negative for ear pain and sore throat.   Eyes: Negative for visual disturbance.  Respiratory:  Negative for cough and shortness of breath.   Cardiovascular: Positive for chest pain and leg swelling (chronic, unchanged).  Gastrointestinal: Negative for abdominal pain, constipation, diarrhea, nausea and vomiting.  Genitourinary: Negative for dysuria and hematuria.  Musculoskeletal: Negative for back pain.  Skin: Negative for rash.  Neurological: Negative for headaches.  All other systems reviewed and are negative.   Physical Exam Updated Vital Signs BP 131/74 (BP Location: Right Arm)    Pulse 66    Temp 97.9 F (36.6 C) (Oral)    Resp 15    Ht 5\' 6"  (1.676 m)    Wt 117.9 kg    SpO2 100%    BMI 41.97 kg/m   Physical Exam Vitals signs and nursing note reviewed.  Constitutional:      General: She is not in acute distress.    Appearance: She is well-developed.  HENT:     Head: Normocephalic and atraumatic.  Eyes:     Conjunctiva/sclera: Conjunctivae normal.  Neck:     Musculoskeletal: Neck supple.  Cardiovascular:     Rate and Rhythm: Normal rate and regular rhythm.     Heart sounds: No murmur.  Pulmonary:     Effort: Pulmonary effort is normal. No respiratory distress.     Breath sounds: Normal breath sounds. No decreased breath sounds, wheezing, rhonchi or rales.  Chest:     Chest wall: Tenderness (left upper chest (reproduces pain)) present.  Abdominal:     General: Bowel sounds are normal.     Palpations: Abdomen is soft.     Tenderness: There is no abdominal tenderness.     Comments: Mild epigastric/ruq ttp without guarding or rebound  Skin:    General: Skin is warm and dry.  Neurological:     Mental Status: She is alert.      ED Treatments / Results  Labs (all labs ordered are listed, but only abnormal results are displayed) Labs Reviewed  BASIC METABOLIC PANEL - Abnormal; Notable for the following components:      Result Value   BUN 5 (*)    All other components within normal limits  SARS CORONAVIRUS 2 (TAT 6-24 HRS)  CBC  HEPATIC FUNCTION PANEL    LIPASE, BLOOD  TROPONIN I (HIGH SENSITIVITY)  TROPONIN I (HIGH SENSITIVITY)    EKG None  Radiology Dg Chest 2 View  Result Date: 08/22/2019 CLINICAL DATA:  53 year old female with history of chest pain, most severe in the left side, radiating into the left shoulder. Shortness of breath and dizziness. EXAM: CHEST - 2 VIEW COMPARISON:  No priors. FINDINGS: Lung volumes are normal. No consolidative airspace disease. No pleural effusions. No pneumothorax. No pulmonary nodule or mass noted. Pulmonary vasculature and the cardiomediastinal silhouette are within normal limits. IMPRESSION: No radiographic evidence of acute cardiopulmonary disease. Electronically Signed   By: Trudie Reedaniel  Entrikin M.D.   On: 08/22/2019 10:02    Procedures  Procedures (including critical care time)  Medications Ordered in ED Medications  sodium chloride flush (NS) 0.9 % injection 3 mL (3 mLs Intravenous Not Given 08/22/19 1402)  pantoprazole (PROTONIX) EC tablet 40 mg (40 mg Oral Given 08/22/19 1359)  ketorolac (TORADOL) 30 MG/ML injection 30 mg (30 mg Intramuscular Given 08/22/19 1359)     Initial Impression / Assessment and Plan / ED Course  I have reviewed the triage vital signs and the nursing notes.  Pertinent labs & imaging results that were available during my care of the patient were reviewed by me and considered in my medical decision making (see chart for details).   Final Clinical Impressions(s) / ED Diagnoses   Final diagnoses:  Atypical chest pain  Gastroesophageal reflux disease, unspecified whether esophagitis present   53 y/o female who presents to the ED for eval of left sided chest pain.   CBC WNL BMP WNL Hepatic function panel WNL Lipase WNL COVID pending at discharge  EKG with NSR, no ischemic changes.   CXR WNL  53 year old female with intermittent left-sided chest pain which seems to be positional in nature and is also reproduced on exam.  It is not associated with exertion.   Seems very less risk to be related to ACS.  Heart score 2 (age, risk factors).  Doubt PE, dissection, esophageal rupture or other emergent cardiac/pulmonary etiology at this time.  She feels much improved after Toradol and Protonix in the ED.  I also wonder if her symptoms are related to GERD since she states symptoms started after she ran out of her PPI.  I will give her a refill for this and we will also start her on an anti-inflammatory given there is also suspicion for musculoskeletal cause with the reproducibility on exam.  Additionally, with her recent exposure to COVID, will retest her for this.  Advised on quarantine measures.  Have advised that she follow-up with her PCP in 1 week for reevaluation and to return to the ED for new or worsening symptoms.  She voices understanding and is in agreement plan.  All questions answered.  Patient stable discharge.  ---  [name] was evaluated in Emergency Department on 08/22/2019 for the symptoms described in the history of present illness. She was evaluated in the context of the global COVID-19 pandemic, which necessitated consideration that the patient might be at risk for infection with the SARS-CoV-2 virus that causes COVID-19. Institutional protocols and algorithms that pertain to the evaluation of patients at risk for COVID-19 are in a state of rapid change based on information released by regulatory bodies including the CDC and federal and state organizations. These policies and algorithms were followed during the patient's care in the ED.   ED Discharge Orders         Ordered    Dexlansoprazole (DEXILANT) 30 MG capsule  Daily     08/22/19 1540    naproxen (NAPROSYN) 500 MG tablet  2 times daily     08/22/19 [address] Grason Brailsford S, PA-C 08/22/19 1605    Geoffery Lyons, MD 08/22/19 1619

## 2019-08-22 NOTE — Discharge Instructions (Addendum)

## 2019-08-22 NOTE — ED Notes (Signed)
Pt verbalized understanding of discharge instructions and follow up care

## 2020-06-11 ENCOUNTER — Encounter (HOSPITAL_COMMUNITY): Payer: Self-pay

## 2020-06-11 ENCOUNTER — Ambulatory Visit (HOSPITAL_COMMUNITY)
Admission: EM | Admit: 2020-06-11 | Discharge: 2020-06-11 | Disposition: A | Discharge status: Home / Self Care | Payer: Commercial Managed Care - PPO | Attending: Family Medicine | Admitting: Family Medicine

## 2020-06-11 ENCOUNTER — Other Ambulatory Visit: Payer: Self-pay

## 2020-06-11 DIAGNOSIS — K2100 Gastro-esophageal reflux disease with esophagitis, without bleeding: Secondary | ICD-10-CM

## 2020-06-11 MED ORDER — PANTOPRAZOLE SODIUM 40 MG PO TBEC: 40.0000 mg | DELAYED_RELEASE_TABLET | Freq: Every day | ORAL | 0 refills | Status: DC

## 2020-06-11 MED ORDER — SUCRALFATE 1 GM/10ML PO SUSP: 1.0000 g | Freq: Three times a day (TID) | ORAL | 0 refills | Status: DC

## 2020-06-11 NOTE — ED Notes (Addendum)
Pt felt dizzy when sitting and standing.

## 2020-06-11 NOTE — ED Triage Notes (Addendum)
Pt presents with chest pain since this morning; headache and dizziness x 2 days. States feeling dizzy every time she stand up. Pt reports she suffered of heartburn, but never had a chest pain. Denies abnormal pain, diarrhea, numbness, weakness.   Pt reports dexlansoprazole gives relief, but is not covered by the insurance and with [facility] is $300-400.

## 2020-06-11 NOTE — ED Provider Notes (Signed)
[facility]    CSN: [id number] Arrival date & time: 06/11/20  1214      History   Chief Complaint Chief Complaint  Patient presents with   Dizziness   Headache   Chest Pain    HPI Denise Booth is a 54 y.o. female.   Denise Booth presents with complaints of epigastric and esophageal pain. Has had worsening of heart burn over the past 1-2 weeks with increased belching. The chest pain work her up this morning around 3 am. She has had issues with similar pain in the past, went to the er, had ACS work up completed, was ultimately diagnosed with esophagitis. She has been diagnosed with h. Pylori in the past, but due to allergies to antibiotics never completed a course of antibiotics. dexilant has been helpful for her in the past with her symptoms, but insurance no longer covers it, so she has not taken it in the past 6 months. She has tried taking tums and pepcid which don't help. No arm pain, jaw pain, diaphoresis, pain with exertion, cough, shortness of breath , leg pain or swelling. Pain is worse if she is hungry. No blood or black in stool. Some nausea, no vomiting. She has a mild headache and dizziness, which has happened in the past with similar symptoms.    ROS per HPI, negative if not otherwise mentioned.      Past Medical History:  Diagnosis Date   Allergy    Chicken pox    GERD (gastroesophageal reflux disease)    Migraine headache    Thyroid disease    Ulcer     Patient Active Problem List   Diagnosis Date Noted   Abdominal pain, epigastric 03/17/2017   Belching 03/17/2017   Bloating 03/17/2017   Nausea without vomiting 03/17/2017   Gastroenteritis 02/26/2017   Morbid obesity ([facility]) 02/16/2017   GERD (gastroesophageal reflux disease) 02/16/2017   Migraines 02/16/2017    Past Surgical History:  Procedure Laterality Date   ABDOMINAL HYSTERECTOMY     CHOLECYSTECTOMY     TUBAL LIGATION      OB History   No  obstetric history on file.      Home Medications    Prior to Admission medications   Medication Sig Start Date End Date Taking? Authorizing Provider  acetaminophen (TYLENOL) 500 MG tablet Take 1,000 mg by mouth every 6 (six) hours as needed for moderate pain.    [provider]  Cholecalciferol (VITAMIN D3 PO) Take 1 tablet by mouth daily.    [provider]  Dexlansoprazole (DEXILANT) 30 MG capsule Take 2 capsules (60 mg total) by mouth daily. 08/22/19   Couture, Denise S, PA-C  levothyroxine (SYNTHROID, LEVOTHROID) 50 MCG tablet Take 50 mcg by mouth daily.    [provider]  loratadine (CLARITIN) 10 MG tablet Take 10 mg by mouth daily as needed for allergies.    [provider]  Multiple Vitamins-Minerals (ZINC PO) Take 1 tablet by mouth daily.    [provider]  omeprazole (PRILOSEC) 40 MG capsule Take 1 capsule (40 mg total) by mouth daily. Patient not taking: Reported on 08/22/2019 03/23/17   Denise Deeds, MD  pantoprazole (PROTONIX) 40 MG tablet Take 1 tablet (40 mg total) by mouth daily. 06/11/20   Denise Haber, NP  sucralfate (CARAFATE) 1 GM/10ML suspension Take 10 mLs (1 g total) by mouth 4 (four) times daily -  with meals and at bedtime. 06/11/20   Denise Booth  B, NP  topiramate (TOPAMAX) 25 MG tablet Take 25 mg by mouth as needed (migraines).    [provider]    Family History History reviewed. No pertinent family history.  Social History Social History   Tobacco Use   Smoking status: Never Smoker   Smokeless tobacco: Never Used  Substance Use Topics   Alcohol use: No   Drug use: No     Allergies   Phenazopyridine, Clarithromycin, Codeine sulfate, Hydrocodone, Pyridium [phenazopyridine hcl], Tomato, Tramadol, Ceftin [cefuroxime axetil], Flagyl [metronidazole hcl], Penicillins, and Sulfa antibiotics   Review of Systems Review of Systems   Physical Exam Triage Vital Signs ED Triage Vitals    Enc Vitals Group     BP 06/11/20 1251 137/62     Pulse Rate 06/11/20 1251 71     Resp 06/11/20 1251 16     Temp 06/11/20 1251 98.5 F (36.9 C)     Temp Source 06/11/20 1251 Oral     SpO2 06/11/20 1251 100 %     Weight --      Height --      Head Circumference --      Peak Flow --      Pain Score 06/11/20 1315 5     Pain Loc --      Pain Edu? --      Excl. in GC? --    Orthostatic VS for the past 24 hrs:  BP- Lying Pulse- Lying BP- Sitting Pulse- Sitting BP- Standing at 0 minutes Pulse- Standing at 0 minutes  06/11/20 1341 132/58 62 121/76 78 123/66 77    Updated Vital Signs BP 137/62 (BP Location: Left Arm)    Pulse 71    Temp 98.5 F (36.9 C) (Oral)    Resp 16    SpO2 100%    Physical Exam Constitutional:      General: She is not in acute distress.    Appearance: She is well-developed.  Cardiovascular:     Rate and Rhythm: Normal rate and regular rhythm.  Pulmonary:     Effort: Pulmonary effort is normal.  Chest:     Chest wall: No tenderness.  Abdominal:     Tenderness: There is no abdominal tenderness.  Skin:    General: Skin is warm and dry.  Neurological:     Mental Status: She is alert and oriented to person, place, and time.     Cranial Nerves: No cranial nerve deficit.     Sensory: No sensory deficit.  Psychiatric:        Mood and Affect: Mood normal.    EKG:  NSR rate of 66. Previous EKG was available for review. No stwave changes as interpreted by me.    UC Treatments / Results  Labs (all labs ordered are listed, but only abnormal results are displayed) Labs Reviewed - No data to display  EKG   Radiology No results found.  Procedures Procedures (including critical care time)  Medications Ordered in UC Medications - No data to display  Initial Impression / Assessment and Plan / UC Course  I have reviewed the triage vital signs and the nursing notes.  Pertinent labs & imaging results that were available during my care of the patient  were reviewed by me and considered in my medical decision making (see chart for details).     Unfortunately this is not a new problem for Denise Booth. Known h. Pylori which has not been treated. Cost of dexilant without insurance prohibits her from  taking regularly. Symptoms and exam consistent with gerd and esophagitis. protonx and carafate provided. Encouraged follow up with GI. Low suspicion for ACS. Return precautions provided. Patient verbalized understanding and agreeable to plan.  Ambulatory out of clinic without difficulty.    Final Clinical Impressions(Booth) / UC Diagnoses   Final diagnoses:  Gastroesophageal reflux disease with esophagitis without hemorrhage   Discharge Instructions   None    ED Prescriptions    Medication Sig Dispense Auth. Provider   pantoprazole (PROTONIX) 40 MG tablet Take 1 tablet (40 mg total) by mouth daily. 60 tablet Denise Booth B, NP   sucralfate (CARAFATE) 1 GM/10ML suspension Take 10 mLs (1 g total) by mouth 4 (four) times daily -  with meals and at bedtime. 420 mL Denise Booth B, NP     PDMP not reviewed this encounter.   Denise Haber, NP 06/11/20 1443

## 2020-09-02 ENCOUNTER — Emergency Department (HOSPITAL_COMMUNITY)
Admission: EM | Admit: 2020-09-02 | Discharge: 2020-09-02 | Disposition: A | Discharge status: Home / Self Care | Payer: Commercial Managed Care - PPO | Attending: Emergency Medicine | Admitting: Emergency Medicine

## 2020-09-02 ENCOUNTER — Other Ambulatory Visit: Payer: Self-pay

## 2020-09-02 ENCOUNTER — Encounter (HOSPITAL_COMMUNITY): Payer: Self-pay | Admitting: Emergency Medicine

## 2020-09-02 DIAGNOSIS — G43009 Migraine without aura, not intractable, without status migrainosus: Secondary | ICD-10-CM

## 2020-09-02 DIAGNOSIS — R519 Headache, unspecified: Secondary | ICD-10-CM | POA: Diagnosis present

## 2020-09-02 DIAGNOSIS — G43909 Migraine, unspecified, not intractable, without status migrainosus: Secondary | ICD-10-CM | POA: Insufficient documentation

## 2020-09-02 MED ORDER — DIPHENHYDRAMINE HCL 50 MG/ML IJ SOLN
25.0000 mg | Freq: Once | INTRAMUSCULAR | Status: AC
  Administered 2020-09-02: 25 mg via INTRAVENOUS
  Filled 2020-09-02: qty 1

## 2020-09-02 MED ORDER — DEXAMETHASONE SODIUM PHOSPHATE 10 MG/ML IJ SOLN
10.0000 mg | Freq: Once | INTRAMUSCULAR | Status: AC
  Administered 2020-09-02: 10 mg via INTRAVENOUS
  Filled 2020-09-02: qty 1

## 2020-09-02 MED ORDER — SODIUM CHLORIDE 0.9 % IV BOLUS
1000.0000 mL | Freq: Once | INTRAVENOUS | Status: AC
  Administered 2020-09-02: 1000 mL via INTRAVENOUS

## 2020-09-02 MED ORDER — METOCLOPRAMIDE HCL 5 MG/ML IJ SOLN
10.0000 mg | Freq: Once | INTRAMUSCULAR | Status: AC
  Administered 2020-09-02: 10 mg via INTRAVENOUS
  Filled 2020-09-02: qty 2

## 2020-09-02 NOTE — Discharge Instructions (Addendum)
Recheck as needed °

## 2020-09-02 NOTE — ED Provider Notes (Signed)
[facility] Provider Note   CSN: [id number] Arrival date & time: 09/02/20  0252   Time seen 4:01 AM  History Chief Complaint  Patient presents with  . Headache    Denise Booth is a 54 y.o. female.  HPI Patient states she woke up yesterday morning with pain behind her left eye that then spread to her left forehead and left temple.  She describes it as constant and throbbing.  She had mild nausea without vomiting.  She denies any visual changes.  She states she took Tylenol and a half of a Phenergan tonight before bedtime and went to bed and when she woke up pain now extended behind her ear into the left side of her neck.  She denies fever, any new numbness or tingling of her extremities.  She states she has a history of migraine headaches however they were normally on the right side.  She gets headaches every 4 months.  She also describes light noise sensitivity with this headache.  PCP Burchette, Elberta Fortis, MD     Past Medical History:  Diagnosis Date  . Allergy   . Chicken pox   . GERD (gastroesophageal reflux disease)   . Migraine headache   . Thyroid disease   . Ulcer     Patient Active Problem List   Diagnosis Date Noted  . Abdominal pain, epigastric 03/17/2017  . Belching 03/17/2017  . Bloating 03/17/2017  . Nausea without vomiting 03/17/2017  . Gastroenteritis 02/26/2017  . Morbid obesity ([facility]) 02/16/2017  . GERD (gastroesophageal reflux disease) 02/16/2017  . Migraines 02/16/2017    Past Surgical History:  Procedure Laterality Date  . ABDOMINAL HYSTERECTOMY    . CHOLECYSTECTOMY    . TUBAL LIGATION       OB History   No obstetric history on file.     History reviewed. No pertinent family history.  Social History   Tobacco Use  . Smoking status: Never Smoker  . Smokeless tobacco: Never Used  Substance Use Topics  . Alcohol use: No  . Drug use: No  computer work  Home Medications Prior to Admission  medications   Medication Sig Start Date End Date Taking? Authorizing Provider  acetaminophen (TYLENOL) 500 MG tablet Take 1,000 mg by mouth every 6 (six) hours as needed for moderate pain.   Yes [provider]  pantoprazole (PROTONIX) 40 MG tablet Take 1 tablet (40 mg total) by mouth daily. 06/11/20  Yes Burky, Dorene Grebe B, NP  Rimegepant Sulfate (NURTEC) 75 MG TBDP Take 75 mg by mouth daily as needed (migraine).    Yes [provider]  Dexlansoprazole (DEXILANT) 30 MG capsule Take 2 capsules (60 mg total) by mouth daily. Patient not taking: Reported on 09/02/2020 08/22/19   Couture, Cortni S, PA-C  sucralfate (CARAFATE) 1 GM/10ML suspension Take 10 mLs (1 g total) by mouth 4 (four) times daily -  with meals and at bedtime. Patient not taking: Reported on 09/02/2020 06/11/20   Denise Mako B, NP    Allergies    Clarithromycin, Phenazopyridine, Codeine sulfate, Pyridium [phenazopyridine hcl], Tomato, Tramadol, Ceftin [cefuroxime axetil], Codeine, Flagyl [metronidazole hcl], Hydrocodone, Penicillins, and Sulfa antibiotics  Review of Systems   Review of Systems  All other systems reviewed and are negative.   Physical Exam Updated Vital Signs BP 118/74   Pulse 65   Temp 98.2 F (36.8 C) (Oral)   Resp 16   Ht 5\' 8"  (1.727 m)   Wt 127 kg  SpO2 96%   BMI 42.57 kg/m   Physical Exam Vitals and nursing note reviewed.  Constitutional:      General: She is not in acute distress.    Appearance: She is obese.     Comments: Sitting on the side of the stretcher in a well lit room in no distress  HENT:     Head: Normocephalic and atraumatic.     Comments: Sinuses are nontender to palpation    Right Ear: External ear normal.     Left Ear: External ear normal.  Eyes:     Extraocular Movements: Extraocular movements intact.     Conjunctiva/sclera: Conjunctivae normal.     Pupils: Pupils are equal, round, and reactive to light.  Cardiovascular:     Rate and Rhythm: Normal  rate and regular rhythm.     Pulses: Normal pulses.     Heart sounds: Normal heart sounds.  Pulmonary:     Effort: Pulmonary effort is normal. No respiratory distress.     Breath sounds: Normal breath sounds.  Musculoskeletal:        General: Normal range of motion.     Cervical back: Normal range of motion.  Skin:    General: Skin is warm and dry.     Findings: No rash.  Neurological:     General: No focal deficit present.     Mental Status: She is alert and oriented to person, place, and time.     Cranial Nerves: No cranial nerve deficit.  Psychiatric:        Mood and Affect: Mood normal.        Behavior: Behavior normal.        Thought Content: Thought content normal.     ED Results / Procedures / Treatments   Labs (all labs ordered are listed, but only abnormal results are displayed) Labs Reviewed - No data to display  EKG None  Radiology No results found.  Procedures Procedures (including critical care time)  Medications Ordered in ED Medications  sodium chloride 0.9 % bolus 1,000 mL (0 mLs Intravenous Stopped 09/02/20 0556)  metoCLOPramide (REGLAN) injection 10 mg (10 mg Intravenous Given 09/02/20 0427)  diphenhydrAMINE (BENADRYL) injection 25 mg (25 mg Intravenous Given 09/02/20 0427)  dexamethasone (DECADRON) injection 10 mg (10 mg Intravenous Given 09/02/20 0427)    ED Course  I have reviewed the triage vital signs and the nursing notes.  Pertinent labs & imaging results that were available during my care of the patient were reviewed by me and considered in my medical decision making (see chart for details).    MDM Rules/Calculators/A&P                          Patient is agreeable to getting migraine cocktail.  Recheck at 6:30 AM patient states her headache is almost gone.  She feels like she is ready to be discharged.  Final Clinical Impression(Booth) / ED Diagnoses Final diagnoses:  Migraine without aura and without status migrainosus, not  intractable    Rx / DC Orders ED Discharge Orders    None      Plan discharge  Denise Albe, MD, Denise Pyo, MD 09/02/20 [phone]

## 2020-09-02 NOTE — ED Notes (Signed)
Patient here with c/o headache that started yesterday.

## 2020-09-02 NOTE — ED Triage Notes (Signed)
Patient is complaining of a headache that is radiates down the left side of her neck. Patient is also having some nausea. Patient states it started yesterday morning.

## 2020-10-21 LAB — HM MAMMOGRAPHY

## 2020-11-20 ENCOUNTER — Encounter: Payer: Self-pay | Admitting: Family Medicine

## 2021-01-12 ENCOUNTER — Ambulatory Visit (INDEPENDENT_AMBULATORY_CARE_PROVIDER_SITE_OTHER): Payer: 59 | Admitting: Family Medicine

## 2021-01-12 ENCOUNTER — Other Ambulatory Visit: Payer: Self-pay

## 2021-01-12 ENCOUNTER — Encounter: Payer: Self-pay | Admitting: Family Medicine

## 2021-01-12 VITALS — BP 130/84 | HR 76 | Temp 98.0°F | Ht 68.0 in | Wt 295.4 lb

## 2021-01-12 DIAGNOSIS — K219 Gastro-esophageal reflux disease without esophagitis: Secondary | ICD-10-CM | POA: Diagnosis not present

## 2021-01-12 DIAGNOSIS — Z8619 Personal history of other infectious and parasitic diseases: Secondary | ICD-10-CM

## 2021-01-12 NOTE — Progress Notes (Signed)
Established Patient Office Visit  Subjective:  Patient ID: Denise Booth, female    DOB: November 16, 1965  Age: 55 y.o. MRN: [id number]  CC:  Chief Complaint  Patient presents with  . Gastroesophageal Reflux    Heartburn , hurting in chest , pain when pain  when laying down , flare up on Saturday     HPI Denise Booth presents for recent episode of severe heartburn.  She has longstanding history of GERD.  She takes Protonix 40 mg daily.  She states Saturday night she went out to eat.  She had flounder and some slaw predominantly.  She ate around 6 PM.  After laying down to sleep she woke up with warm sour reflux up into her throat region.  She was concerned she may have aspirated slightly.  She has not had any persistent cough.  She got up and drank some milk but has had some soreness since then.  She is swallowing liquids and solids without difficulty.  Still has some soreness.  She states that she was treated at 1 point for her reflux with Dexilant that seemed to work the best.  Her insurance would only cover Protonix.  She had EGD 2018 with positive Helicobacter pylori.  This apparently was never treated.  She has taken at one point Carafate which seemed to help with some discomfort.  She has not tried any over-the-counter antacid such as Pepcid or Zantac to supplement.  No recent appetite changes.  No weight loss.  She has gained some weight over the past couple years.  Past Medical History:  Diagnosis Date  . Allergy   . Chicken pox   . GERD (gastroesophageal reflux disease)   . Migraine headache   . Thyroid disease   . Ulcer     Past Surgical History:  Procedure Laterality Date  . ABDOMINAL HYSTERECTOMY    . CHOLECYSTECTOMY    . TUBAL LIGATION      No family history on file.  Social History   Socioeconomic History  . Marital status: Married    Spouse name: Not on file  . Number of children: Not on file  . Years of education: Not on file  . Highest education  level: Not on file  Occupational History  . Not on file  Tobacco Use  . Smoking status: Never Smoker  . Smokeless tobacco: Never Used  Substance and Sexual Activity  . Alcohol use: No  . Drug use: No  . Sexual activity: Not on file  Other Topics Concern  . Not on file  Social History Narrative  . Not on file   Social Determinants of Health   Financial Resource Strain: Not on file  Food Insecurity: Not on file  Transportation Needs: Not on file  Physical Activity: Not on file  Stress: Not on file  Social Connections: Not on file  Intimate Partner Violence: Not on file    Outpatient Medications Prior to Visit  Medication Sig Dispense Refill  . acetaminophen (TYLENOL) 500 MG tablet Take 1,000 mg by mouth every 6 (six) hours as needed for moderate pain.    . pantoprazole (PROTONIX) 40 MG tablet Take 1 tablet (40 mg total) by mouth daily. 60 tablet 0  . Rimegepant Sulfate (NURTEC) 75 MG TBDP Take 75 mg by mouth daily as needed (migraine).  (Patient not taking: Reported on 01/12/2021)    . Dexlansoprazole (DEXILANT) 30 MG capsule Take 2 capsules (60 mg total) by mouth daily. (Patient not taking: No  sig reported) 30 capsule 0  . sucralfate (CARAFATE) 1 GM/10ML suspension Take 10 mLs (1 g total) by mouth 4 (four) times daily -  with meals and at bedtime. (Patient not taking: Reported on 09/02/2020) 420 mL 0   Facility-Administered Medications Prior to Visit  Medication Dose Route Frequency Provider Last Rate Last Admin  . 0.9 %  sodium chloride infusion  500 mL Intravenous Continuous Armbruster, Willaim Rayas, MD        Allergies  Allergen Reactions  . Clarithromycin Hives  . Phenazopyridine Hives  . Codeine Sulfate Itching and Nausea And Vomiting  . Pyridium [Phenazopyridine Hcl] Hives  . Tomato Hives    Face breaks out  . Tramadol Itching and Nausea And Vomiting  . Ceftin [Cefuroxime Axetil] Itching and Rash  . Codeine Itching and Nausea And Vomiting  . Flagyl [Metronidazole Hcl]  Rash  . Hydrocodone Itching and Nausea And Vomiting  . Penicillins Rash  . Sulfa Antibiotics Rash    ROS Review of Systems  Constitutional: Negative for appetite change and unexpected weight change.  Respiratory: Negative for cough and shortness of breath.   Cardiovascular: Negative for leg swelling.  Gastrointestinal: Negative for abdominal pain, blood in stool, nausea and vomiting.      Objective:    Physical Exam Vitals reviewed.  Cardiovascular:     Rate and Rhythm: Normal rate and regular rhythm.  Pulmonary:     Effort: Pulmonary effort is normal.     Breath sounds: Normal breath sounds.  Abdominal:     Palpations: Abdomen is soft. There is no mass.     Tenderness: There is no abdominal tenderness. There is no guarding or rebound.  Neurological:     Mental Status: She is alert.     BP 130/84 (BP Location: Left Arm, Patient Position: Sitting, Cuff Size: Large)   Pulse 76   Temp 98 F (36.7 C) (Oral)   Ht 5\' 8"  (1.727 m)   Wt 295 lb 6.4 oz (134 kg)   SpO2 96%   BMI 44.92 kg/m  Wt Readings from Last 3 Encounters:  01/12/21 295 lb 6.4 oz (134 kg)  09/02/20 280 lb (127 kg)  08/22/19 260 lb (117.9 kg)     Health Maintenance Due  Topic Date Due  . Hepatitis C Screening  Never done  . HIV Screening  Never done  . PAP SMEAR-Modifier  Never done  . COLONOSCOPY (Pts 45-66yrs Insurance coverage will need to be confirmed)  Never done    There are no preventive care reminders to display for this patient.  Lab Results  Component Value Date   TSH 3.68 02/18/2017   Lab Results  Component Value Date   WBC 7.2 08/22/2019   HGB 12.5 08/22/2019   HCT 36.7 08/22/2019   MCV 85.3 08/22/2019   PLT 231 08/22/2019   Lab Results  Component Value Date   NA 138 08/22/2019   K 3.8 08/22/2019   CO2 22 08/22/2019   GLUCOSE 93 08/22/2019   BUN 5 (L) 08/22/2019   CREATININE 0.90 08/22/2019   BILITOT 0.7 08/22/2019   ALKPHOS 68 08/22/2019   AST 25 08/22/2019   ALT 23  08/22/2019   PROT 6.5 08/22/2019   ALBUMIN 3.6 08/22/2019   CALCIUM 9.2 08/22/2019   ANIONGAP 11 08/22/2019   GFR 71.96 02/18/2017   Lab Results  Component Value Date   CHOL 162 02/18/2017   Lab Results  Component Value Date   HDL 37.00 (L) 02/18/2017  Lab Results  Component Value Date   LDLCALC 105 (H) 02/18/2017   Lab Results  Component Value Date   TRIG 102.0 02/18/2017   Lab Results  Component Value Date   CHOLHDL 4 02/18/2017   No results found for: HGBA1C    Assessment & Plan:   Longstanding history of GERD.  Recent episode Saturday night of severe reflux and she has had some mild discomfort since then.  No symptoms to suggest acute coronary issue. -Continue Protonix 40 mg daily -Recommend she supplement with Pepcid 20 mg once or twice daily as needed -Continue strict dietary precautions.  Avoid eating within 3 hours of bedtime. -Consider elevating head of bed 4 or 5 inches -Consider GI referral if symptoms not improving over the next couple of weeks. -Really need her to lose some weight which can help in the long-term with reflux management  No orders of the defined types were placed in this encounter.   Follow-up: No follow-ups on file.    Evelena Peat, MD

## 2021-01-12 NOTE — Patient Instructions (Signed)
Food Choices for Gastroesophageal Reflux Disease, Adult When you have gastroesophageal reflux disease (GERD), the foods you eat and your eating habits are very important. Choosing the right foods can help ease the discomfort of GERD. Consider working with a dietitian to help you make healthy food choices. What are tips for following this plan? Reading food labels  Look for foods that are low in saturated fat. Foods that have less than 5% of daily value (DV) of fat and 0 g of trans fats may help with your symptoms. Cooking  Cook foods using methods other than frying. This may include baking, steaming, grilling, or broiling. These are all methods that do not need a lot of fat for cooking.  To add flavor, try to use herbs that are low in spice and acidity. Meal planning  Choose healthy foods that are low in fat, such as fruits, vegetables, whole grains, low-fat dairy products, lean meats, fish, and poultry.  Eat frequent, small meals instead of three large meals each day. Eat your meals slowly, in a relaxed setting. Avoid bending over or lying down until 2-3 hours after eating.  Limit high-fat foods such as fatty meats or fried foods.  Limit your intake of fatty foods, such as oils, butter, and shortening.  Avoid the following as told by your health care provider: ? Foods that cause symptoms. These may be different for different people. Keep a food diary to keep track of foods that cause symptoms. ? Alcohol. ? Drinking large amounts of liquid with meals. ? Eating meals during the 2-3 hours before bed.   Lifestyle  Maintain a healthy weight. Ask your health care provider what weight is healthy for you. If you need to lose weight, work with your health care provider to do so safely.  Exercise for at least 30 minutes on 5 or more days each week, or as told by your health care provider.  Avoid wearing clothes that fit tightly around your waist and chest.  Do not use any products that  contain nicotine or tobacco. These products include cigarettes, chewing tobacco, and vaping devices, such as e-cigarettes. If you need help quitting, ask your health care provider.  Sleep with the head of your bed raised. Use a wedge under the mattress or blocks under the bed frame to raise the head of the bed.  Chew sugar-free gum after mealtimes. What foods should I eat? Eat a healthy, well-balanced diet of fruits, vegetables, whole grains, low-fat dairy products, lean meats, fish, and poultry. Each person is different. Foods that may trigger symptoms in one person may not trigger any symptoms in another person. Work with your health care provider to identify foods that are safe for you. The items listed above may not be a complete list of recommended foods and beverages. Contact a dietitian for more information.   What foods should I avoid? Limiting some of these foods may help manage the symptoms of GERD. Everyone is different. Consult a dietitian or your health care provider to help you identify the exact foods to avoid, if any. Fruits Any fruits prepared with added fat. Any fruits that cause symptoms. For some people this may include citrus fruits, such as oranges, grapefruit, pineapple, and lemons. Vegetables Deep-fried vegetables. French fries. Any vegetables prepared with added fat. Any vegetables that cause symptoms. For some people, this may include tomatoes and tomato products, chili peppers, onions and garlic, and horseradish. Grains Pastries or quick breads with added fat. Meats and other proteins High-fat   meats, such as fatty beef or pork, hot dogs, ribs, ham, sausage, salami, and bacon. Fried meat or protein, including fried fish and fried chicken. Nuts and nut butters, in large amounts. Dairy Whole milk and chocolate milk. Sour cream. Cream. Ice cream. Cream cheese. Milkshakes. Fats and oils Butter. Margarine. Shortening. Ghee. Beverages Coffee and tea, with or without  caffeine. Carbonated beverages. Sodas. Energy drinks. Fruit juice made with acidic fruits, such as orange or grapefruit. Tomato juice. Alcoholic drinks. Sweets and desserts Chocolate and cocoa. Donuts. Seasonings and condiments Pepper. Peppermint and spearmint. Added salt. Any condiments, herbs, or seasonings that cause symptoms. For some people, this may include curry, hot sauce, or vinegar-based salad dressings. The items listed above may not be a complete list of foods and beverages to avoid. Contact a dietitian for more information. Questions to ask your health care provider Diet and lifestyle changes are usually the first steps that are taken to manage symptoms of GERD. If diet and lifestyle changes do not improve your symptoms, talk with your health care provider about taking medicines. Where to find more information  International Foundation for Gastrointestinal Disorders: aboutgerd.org Summary  When you have gastroesophageal reflux disease (GERD), food and lifestyle choices may be very helpful in easing the discomfort of GERD.  Eat frequent, small meals instead of three large meals each day. Eat your meals slowly, in a relaxed setting. Avoid bending over or lying down until 2-3 hours after eating.  Limit high-fat foods such as fatty meats or fried foods. This information is not intended to replace advice given to you by your health care provider. Make sure you discuss any questions you have with your health care provider. Document Revised: 05/05/2020 Document Reviewed: 05/05/2020 Elsevier Patient Education  2021 Elsevier Inc.  Add Pepcid 20 mg once or twice daily as needed for GERD symptoms (in addition  To the Protonix).

## 2021-03-19 ENCOUNTER — Emergency Department (HOSPITAL_COMMUNITY): Payer: 59

## 2021-03-19 ENCOUNTER — Encounter (HOSPITAL_COMMUNITY): Payer: Self-pay

## 2021-03-19 ENCOUNTER — Emergency Department (HOSPITAL_COMMUNITY)
Admission: EM | Admit: 2021-03-19 | Discharge: 2021-03-19 | Disposition: A | Discharge status: Home / Self Care | Payer: 59 | Attending: Emergency Medicine | Admitting: Emergency Medicine

## 2021-03-19 DIAGNOSIS — M546 Pain in thoracic spine: Secondary | ICD-10-CM

## 2021-03-19 DIAGNOSIS — M545 Low back pain, unspecified: Secondary | ICD-10-CM | POA: Diagnosis not present

## 2021-03-19 DIAGNOSIS — R1013 Epigastric pain: Secondary | ICD-10-CM | POA: Diagnosis present

## 2021-03-19 DIAGNOSIS — K219 Gastro-esophageal reflux disease without esophagitis: Secondary | ICD-10-CM | POA: Insufficient documentation

## 2021-03-19 DIAGNOSIS — M5134 Other intervertebral disc degeneration, thoracic region: Secondary | ICD-10-CM

## 2021-03-19 LAB — URINALYSIS, ROUTINE W REFLEX MICROSCOPIC
Bilirubin Urine: NEGATIVE
Glucose, UA: NEGATIVE mg/dL
Ketones, ur: NEGATIVE mg/dL
Leukocytes,Ua: NEGATIVE
Nitrite: NEGATIVE
Protein, ur: NEGATIVE mg/dL
Specific Gravity, Urine: 1.006 (ref 1.005–1.030)
pH: 7 (ref 5.0–8.0)

## 2021-03-19 LAB — COMPREHENSIVE METABOLIC PANEL
ALT: 19 U/L (ref 0–44)
AST: 21 U/L (ref 15–41)
Albumin: 4.1 g/dL (ref 3.5–5.0)
Alkaline Phosphatase: 78 U/L (ref 38–126)
Anion gap: 6 (ref 5–15)
BUN: 8 mg/dL (ref 6–20)
CO2: 27 mmol/L (ref 22–32)
Calcium: 9 mg/dL (ref 8.9–10.3)
Chloride: 103 mmol/L (ref 98–111)
Creatinine, Ser: 0.85 mg/dL (ref 0.44–1.00)
GFR, Estimated: >60 mL/min (ref >60)
Glucose, Bld: 102 mg/dL — ABNORMAL HIGH (ref 70–99)
Potassium: 4 mmol/L (ref 3.5–5.1)
Sodium: 136 mmol/L (ref 135–145)
Total Bilirubin: 0.7 mg/dL (ref 0.3–1.2)
Total Protein: 7.3 g/dL (ref 6.5–8.1)

## 2021-03-19 LAB — CBC WITH DIFFERENTIAL/PLATELET
Abs Immature Granulocytes: 0.03 10*3/uL (ref 0.00–0.07)
Basophils Absolute: 0 10*3/uL (ref 0.0–0.1)
Basophils Relative: 0 %
Eosinophils Absolute: 0.1 10*3/uL (ref 0.0–0.5)
Eosinophils Relative: 2 %
HCT: 37.7 % (ref 36.0–46.0)
Hemoglobin: 12.2 g/dL (ref 12.0–15.0)
Immature Granulocytes: 0 %
Lymphocytes Relative: 18 %
Lymphs Abs: 1.6 10*3/uL (ref 0.7–4.0)
MCH: 27.7 pg (ref 26.0–34.0)
MCHC: 32.4 g/dL (ref 30.0–36.0)
MCV: 85.7 fL (ref 80.0–100.0)
Monocytes Absolute: 0.6 10*3/uL (ref 0.1–1.0)
Monocytes Relative: 7 %
Neutro Abs: 6.1 10*3/uL (ref 1.7–7.7)
Neutrophils Relative %: 73 %
Platelets: 242 10*3/uL (ref 150–400)
RBC: 4.4 MIL/uL (ref 3.87–5.11)
RDW: 13.5 % (ref 11.5–15.5)
WBC: 8.5 10*3/uL (ref 4.0–10.5)
nRBC: 0 % (ref 0.0–0.2)

## 2021-03-19 LAB — LIPASE, BLOOD: Lipase: 31 U/L (ref 11–51)

## 2021-03-19 MED ORDER — ONDANSETRON HCL 4 MG/2ML IJ SOLN
4.0000 mg | Freq: Once | INTRAMUSCULAR | Status: AC
  Administered 2021-03-19: 4 mg via INTRAVENOUS
  Filled 2021-03-19: qty 2

## 2021-03-19 MED ORDER — FENTANYL CITRATE (PF) 100 MCG/2ML IJ SOLN
25.0000 ug | Freq: Once | INTRAMUSCULAR | Status: AC
  Administered 2021-03-19: 25 ug via INTRAVENOUS
  Filled 2021-03-19: qty 2

## 2021-03-19 MED ORDER — FENTANYL CITRATE (PF) 100 MCG/2ML IJ SOLN: 50.0000 ug | Freq: Once | INTRAMUSCULAR | Status: DC

## 2021-03-19 MED ORDER — PANTOPRAZOLE SODIUM 40 MG IV SOLR
40.0000 mg | Freq: Once | INTRAVENOUS | Status: AC
  Administered 2021-03-19: 40 mg via INTRAVENOUS
  Filled 2021-03-19: qty 40

## 2021-03-19 MED ORDER — SODIUM CHLORIDE 0.9 % IV BOLUS
1000.0000 mL | Freq: Once | INTRAVENOUS | Status: AC
  Administered 2021-03-19: 1000 mL via INTRAVENOUS

## 2021-03-19 NOTE — ED Provider Notes (Signed)
[facility] EMERGENCY DEPARTMENT Provider Note  CSN: [id number] Arrival date & time: 03/19/21 1114    History Chief Complaint  Patient presents with  . Abdominal Pain    HPI  Denise Booth is a 55 y.o. female with history of cholecystectomy and hysterectomy reports onset of epigastric pain radiating into her back about 2 days ago. Pain has been persistent but more consistently in her back than in her abdomen now. Pain is moderate to severe, worse with movement and eating. She has been nauseated, no vomiting. Some constipation, no hematochezia or melena. She was seen at [facility] and had an H. Pylori test done, results not back yet.   Past Medical History:  Diagnosis Date  . Allergy   . Chicken pox   . GERD (gastroesophageal reflux disease)   . Migraine headache   . Thyroid disease   . Ulcer     Past Surgical History:  Procedure Laterality Date  . ABDOMINAL HYSTERECTOMY    . CHOLECYSTECTOMY    . TUBAL LIGATION      History reviewed. No pertinent family history.  Social History   Tobacco Use  . Smoking status: Never Smoker  . Smokeless tobacco: Never Used  Substance Use Topics  . Alcohol use: No  . Drug use: No     Home Medications Prior to Admission medications   Medication Sig Start Date End Date Taking? Authorizing Provider  acetaminophen (TYLENOL) 500 MG tablet Take 1,000 mg by mouth every 6 (six) hours as needed for moderate pain.   Yes [provider]  ciprofloxacin (CIPRO) 500 MG tablet Take 500 mg by mouth 2 (two) times daily.   Yes [provider]  loratadine (CLARITIN) 10 MG tablet Take 10 mg by mouth daily as needed for allergies.   Yes [provider]  pantoprazole (PROTONIX) 40 MG tablet Take 1 tablet (40 mg total) by mouth daily. 06/11/20  Yes Burky, Dorene Grebe B, NP  Rimegepant Sulfate (NURTEC) 75 MG TBDP Take 75 mg by mouth daily as needed (migraine).   Yes [provider]     Allergies    Clarithromycin,  Phenazopyridine, Codeine sulfate, Pyridium [phenazopyridine hcl], Tomato, Tramadol, Ceftin [cefuroxime axetil], Codeine, Flagyl [metronidazole hcl], Hydrocodone, Penicillins, and Sulfa antibiotics   Review of Systems   Review of Systems A comprehensive review of systems was completed and negative except as noted in HPI.    Physical Exam BP (!) 150/71   Pulse 61   Temp 97.9 F (36.6 C) (Oral)   Resp 11   SpO2 96%   Physical Exam Vitals and nursing note reviewed.  Constitutional:      Appearance: Normal appearance.  HENT:     Head: Normocephalic and atraumatic.     Nose: Nose normal.     Mouth/Throat:     Mouth: Mucous membranes are moist.  Eyes:     Extraocular Movements: Extraocular movements intact.     Conjunctiva/sclera: Conjunctivae normal.  Cardiovascular:     Rate and Rhythm: Normal rate.  Pulmonary:     Effort: Pulmonary effort is normal.     Breath sounds: Normal breath sounds.  Abdominal:     General: Abdomen is flat.     Palpations: Abdomen is soft.     Tenderness: There is abdominal tenderness in the epigastric area. There is no guarding. Negative signs include Murphy's sign and McBurney's sign.  Musculoskeletal:        General: Tenderness (R paraspinal lumbar muscles) present. No swelling. Normal range  of motion.     Cervical back: Neck supple.  Skin:    General: Skin is warm and dry.  Neurological:     General: No focal deficit present.     Mental Status: She is alert.  Psychiatric:        Mood and Affect: Mood normal.      ED Results / Procedures / Treatments   Labs (all labs ordered are listed, but only abnormal results are displayed) Labs Reviewed  COMPREHENSIVE METABOLIC PANEL - Abnormal; Notable for the following components:      Result Value   Glucose, Bld 102 (*)    All other components within normal limits  URINALYSIS, ROUTINE W REFLEX MICROSCOPIC - Abnormal; Notable for the following components:   Hgb urine dipstick SMALL (*)     Bacteria, UA RARE (*)    All other components within normal limits  CBC WITH DIFFERENTIAL/PLATELET  LIPASE, BLOOD    EKG EKG Interpretation  Date/Time:  Thursday Mar 19 2021 13:52:08 EDT Ventricular Rate:  62 PR Interval:  140 QRS Duration: 97 QT Interval:  409 QTC Calculation: 416 R Axis:   53 Text Interpretation: Sinus rhythm Borderline T wave abnormalities No significant change since last tracing Confirmed by Susy Frizzle [phone]) on 03/19/2021 1:58:08 PM   Radiology No results found.  Procedures Procedures  Medications Ordered in the ED Medications  ondansetron (ZOFRAN) injection 4 mg (4 mg Intravenous Given 03/19/21 1439)  sodium chloride 0.9 % bolus 1,000 mL (1,000 mLs Intravenous New Bag/Given 03/19/21 1443)  pantoprazole (PROTONIX) injection 40 mg (40 mg Intravenous Given 03/19/21 1443)  fentaNYL (SUBLIMAZE) injection 25 mcg (25 mcg Intravenous Given 03/19/21 1443)     MDM Rules/Calculators/A&P MDM Labs ordered in triage are unremarkable including CBC, CMP, Lipase and UA. Given her continued pain, will give pain meds, PPI, antiemetics and IVF. Send for CT scan for further evaluation of her pain.  ED Course  I have reviewed the triage vital signs and the nursing notes.  Pertinent labs & imaging results that were available during my care of the patient were reviewed by me and considered in my medical decision making (see chart for details).  Clinical Course as of 03/19/21 1523  Thu Mar 19, 2021  1522 Care of the patient signed out to Dr. Effie Shy at the change of shift.  [CS]    Clinical Course User Index [CS] Pollyann Savoy, MD    Final Clinical Impression(s) / ED Diagnoses Final diagnoses:  None    Rx / DC Orders ED Discharge Orders    None       Pollyann Savoy, MD 03/19/21 1523

## 2021-03-19 NOTE — ED Triage Notes (Signed)
Pt presents with c/o abdominal pain that started on Tuesday night with some associated nausea. Pt reports the pain radiates to her back as well.

## 2021-03-19 NOTE — Discharge Instructions (Signed)
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  There were no signs of intra-abdominal problems that would explain your discomfort.  This is likely the source of your pain.  Treatment includes rest, Tylenol, and heat to the sore area.  See your doctor next week for checkup.

## 2021-03-19 NOTE — ED Provider Notes (Signed)
3:15 PM-checkout from Dr. Bernette Mayers to evaluate patient for results of CT and consider disposition.  She presents today for evaluation of abdominal pain.  She has been treated with IV fluids, antiemetic and a PPI.    1540 p.m. -vital signs are normal.  CT indicates mild pancreatitis and diverticulosis.  I reviewed the CT scan, bone windows, indicate degenerative changes of the lower thoracic spine, more right than left-sided.  This correlates with her discomfort.  At this time and comfortable.  Findings discussed and questions answered.  She plans on following up with the urgent care regarding her H. pylori results and will follow up with her PCP regarding her pain.  She declines need for narcotic pain reliever at this time.  She does not have to work until next Monday.  I have advised her on symptomatic treatment.   Mancel Bale, MD 03/19/21 [phone]

## 2021-03-19 NOTE — ED Provider Notes (Addendum)
Emergency Medicine Provider Triage Evaluation Note  Denise Booth , a 55 y.o. female  was evaluated in triage.  Pt complains of epigastric pain.  Pain started Tuesday night and has worsened since then.  Patient reports pain radiates to her back.  Patient endorses chills and nausea.  Previous cholecystectomy and hysterectomy.  Review of Systems  Positive: Epigastric abdominal pain, nausea, chills Negative: Fever, vomiting, constipation, diarrhea, blood in stool, melena, syncope  Physical Exam  BP (!) 163/83 (BP Location: Left Arm)   Pulse 65   Temp 97.9 F (36.6 C) (Oral)   Resp 17   SpO2 99%  Gen:   Awake, no distress   Resp:  Normal effort  MSK:   Moves extremities without difficulty  Other:  Abdomen soft, nondistended, minimal tenderness to epigastric region  Medical Decision Making  Medically screening exam initiated at 12:10 PM.  Appropriate orders placed.  Denise Booth was informed that the remainder of the evaluation will be completed by another provider, this initial triage assessment does not replace that evaluation, and the importance of remaining in the ED until their evaluation is complete.  The patient appears stable so that the remainder of the work up may be completed by another provider.      Haskel Schroeder, PA-C 03/19/21 1212    Haskel Schroeder, PA-C 03/19/21 1213    Pollyann Savoy, MD 03/19/21 1355

## 2022-01-07 IMAGING — CT CT RENAL STONE PROTOCOL
2 of 4 series · 16 of 46 positions shown, 18 images · non-contrast
Comparison: None.

CLINICAL DATA: Right-sided flank pain for 2 days, initial encounter

EXAM:
CT ABDOMEN AND PELVIS WITHOUT CONTRAST
TECHNIQUE: Multidetector CT imaging of the abdomen and pelvis was performed
following the standard protocol without IV contrast.

[Series 2: axial st · axial · 0.88mm/px · z∈[-370,+35]mm · 13 of 93 slices shown, 15 images]
[im 6/93  soft-tissue]
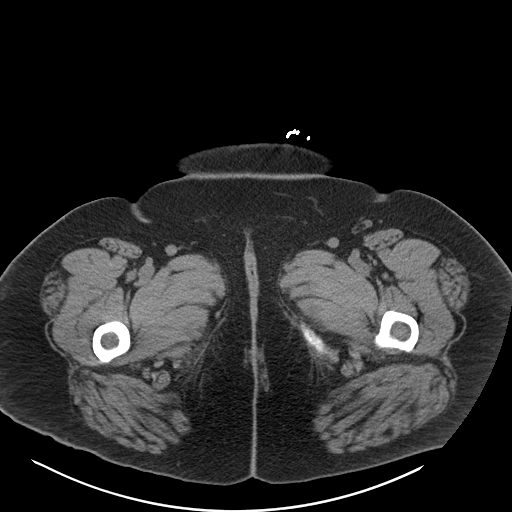
[im 6/93  bone]
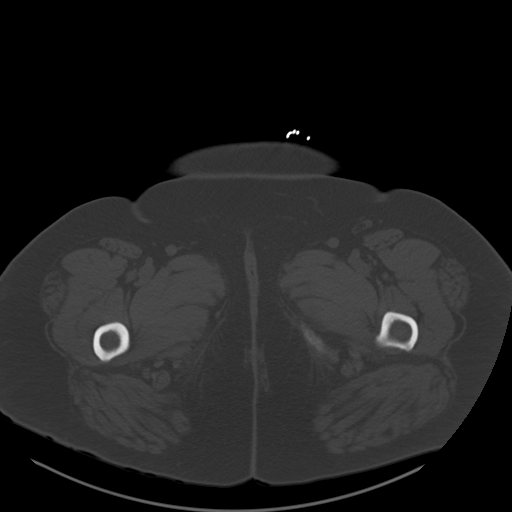
[im 11/93  soft-tissue]
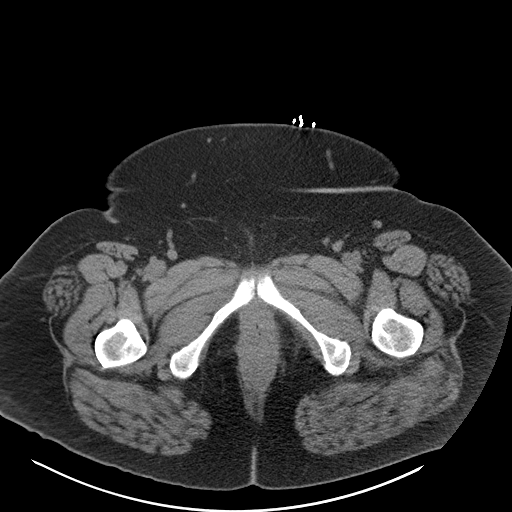
[im 22/93  soft-tissue]
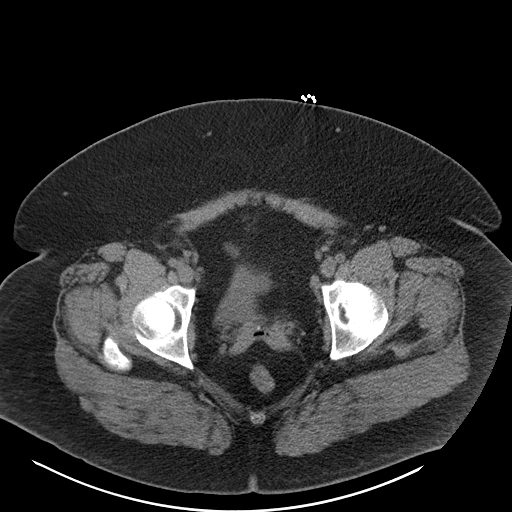
[im 28/93  soft-tissue]
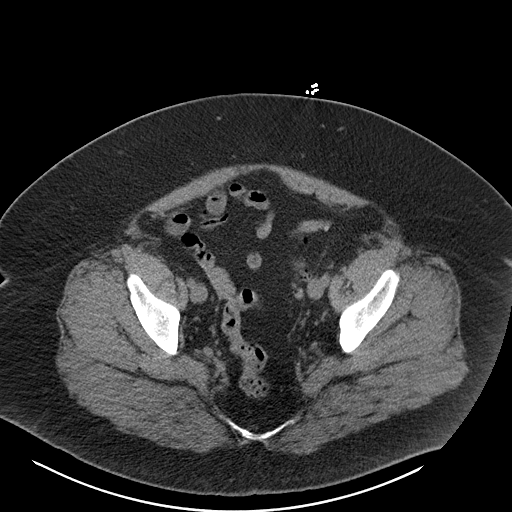
[im 33/93  soft-tissue]
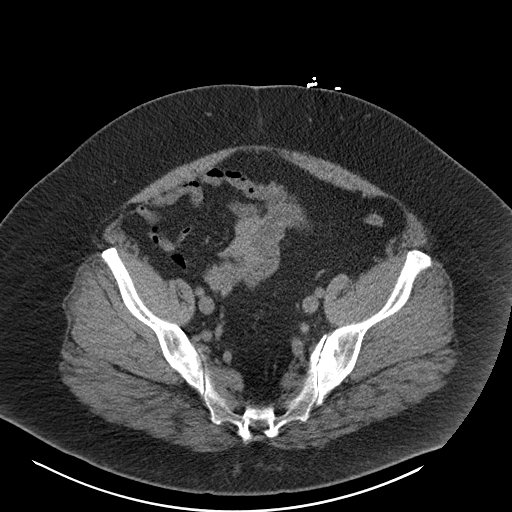
[im 38/93  soft-tissue]
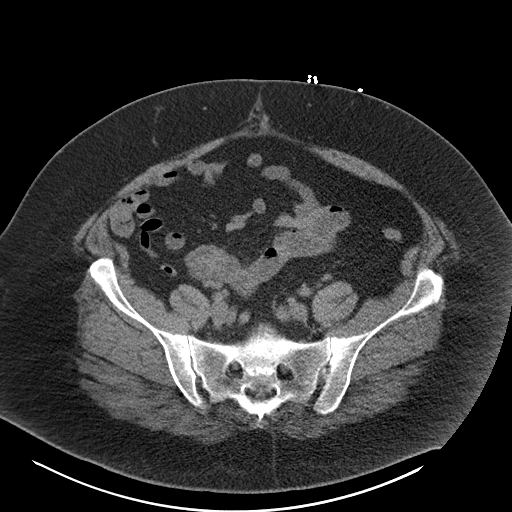
[im 49/93  soft-tissue]
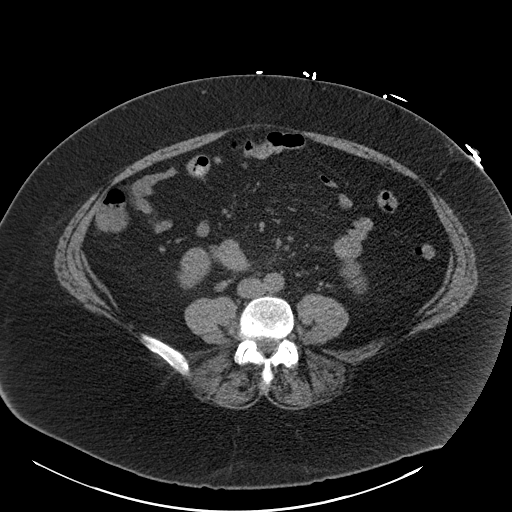
[im 55/93  soft-tissue]
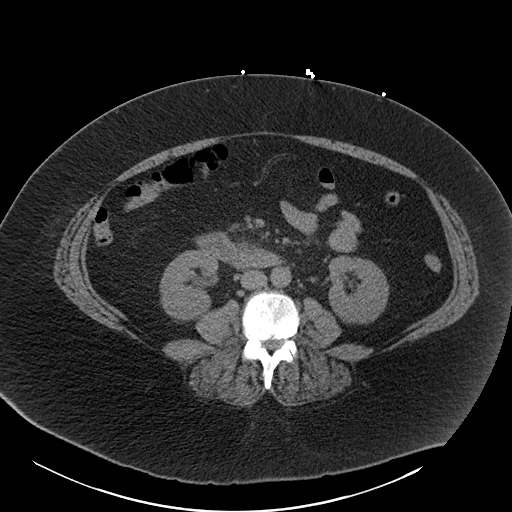
[im 60/93  soft-tissue]
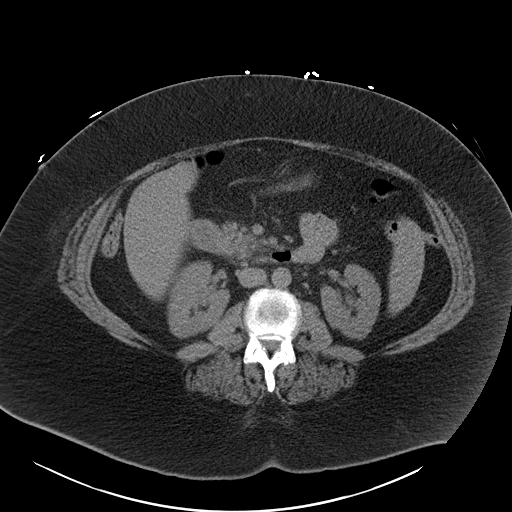
[im 60/93  bone]
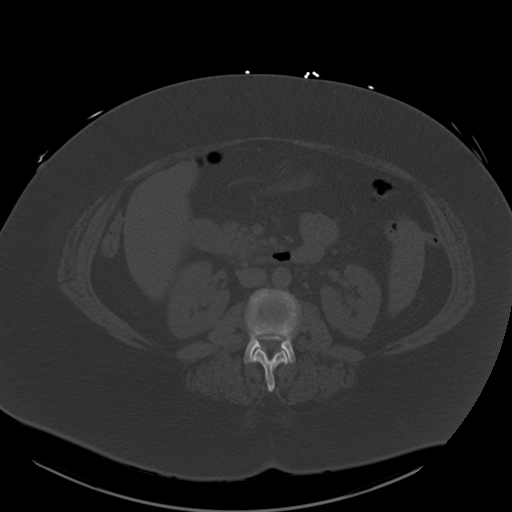
[im 65/93  soft-tissue]
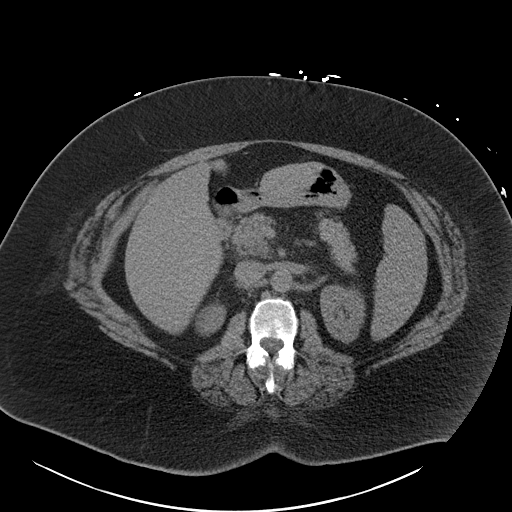
[im 71/93  soft-tissue]
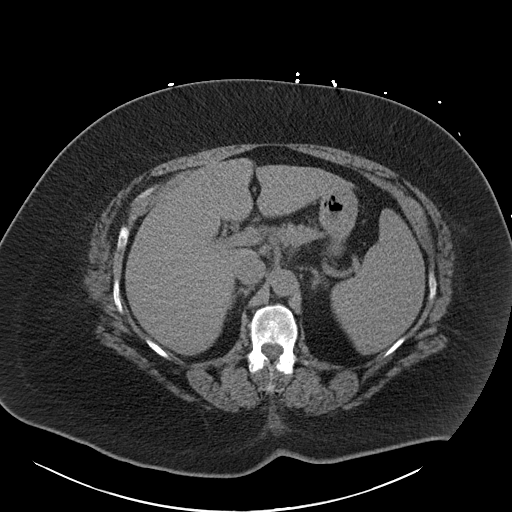
[im 82/93  soft-tissue]
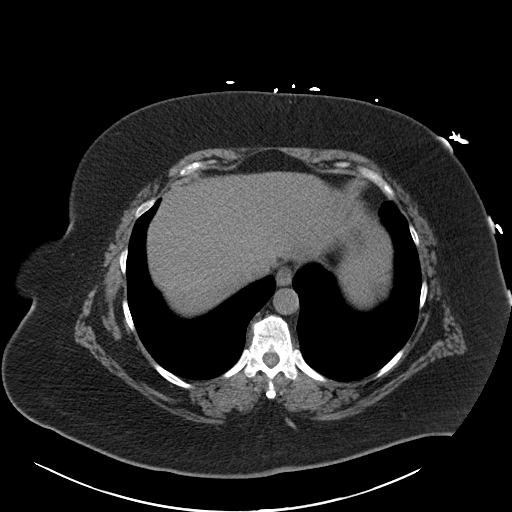
[im 87/93  soft-tissue]
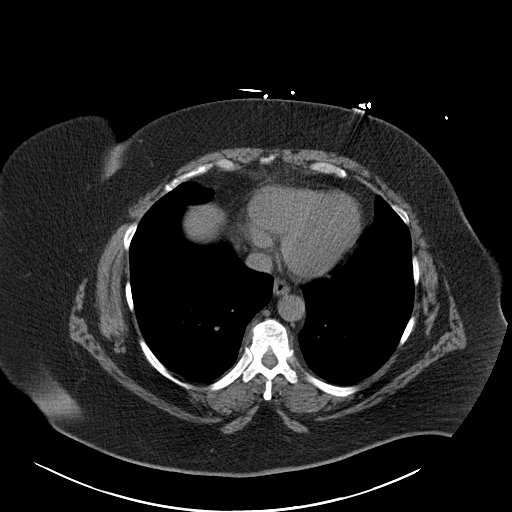

[Series 5: coronal · coronal · 0.91mm/px · 3 of 178 slices shown]
[im 60/178  soft-tissue]
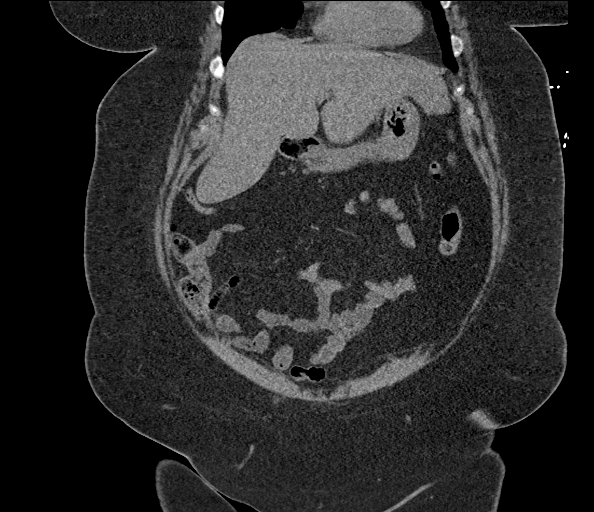
[im 79/178  soft-tissue]
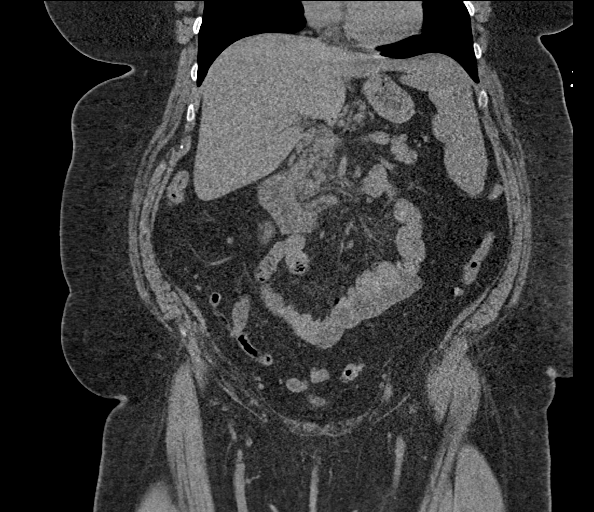
[im 99/178  soft-tissue]
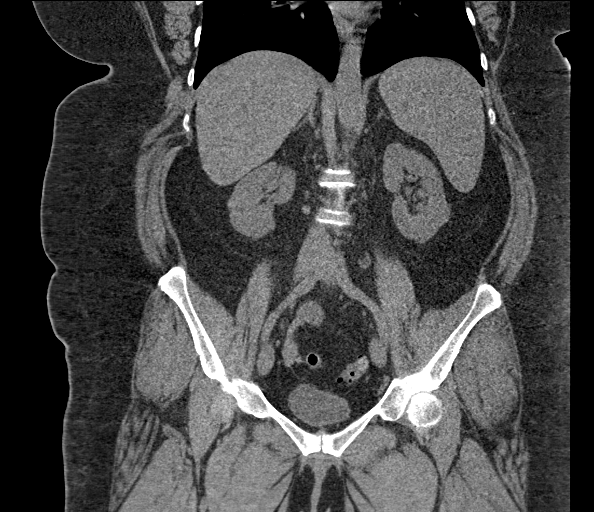

[16 of 46 positions shown; findings below may reference images not displayed]

FINDINGS: Lower chest: No acute abnormality.

Hepatobiliary: No focal liver abnormality is seen. Status post
cholecystectomy. No biliary dilatation.

Pancreas: Very minimal peripancreatic inflammatory changes noted in
the region of the uncinate process which may represent some very
focal pancreatitis. Correlate with physical exam and laboratory
values.

Spleen: Normal in size without focal abnormality.

Adrenals/Urinary Tract: Adrenal glands are within normal limits.
Kidneys show no renal calculi or obstructive changes. The ureters
are within normal limits. The bladder is partially distended.

Stomach/Bowel: Scattered diverticular change of the colon is noted
without evidence of diverticulitis. The appendix is within normal
limits. No inflammatory changes are seen. Small bowel and stomach
are unremarkable.

Vascular/Lymphatic: No significant vascular findings are present. No
enlarged abdominal or pelvic lymph nodes.

Reproductive: Status post hysterectomy. No adnexal masses.

Other: No abdominal wall hernia or abnormality. No abdominopelvic
ascites.

Musculoskeletal: Degenerative changes of lumbar spine are noted.
IMPRESSION: Minimal peripancreatic inflammatory change in the region of the
uncinate process. Correlate with laboratory values and physical
exam.

Mild diverticulosis without evidence of diverticulitis.

No other focal abnormality is seen.

## 2022-02-16 DIAGNOSIS — H10413 Chronic giant papillary conjunctivitis, bilateral: Secondary | ICD-10-CM | POA: Diagnosis not present

## 2022-02-16 DIAGNOSIS — H43392 Other vitreous opacities, left eye: Secondary | ICD-10-CM | POA: Diagnosis not present

## 2022-02-16 DIAGNOSIS — G43B0 Ophthalmoplegic migraine, not intractable: Secondary | ICD-10-CM | POA: Diagnosis not present

## 2022-02-16 DIAGNOSIS — H2513 Age-related nuclear cataract, bilateral: Secondary | ICD-10-CM | POA: Diagnosis not present

## 2022-03-17 DIAGNOSIS — J330 Polyp of nasal cavity: Secondary | ICD-10-CM | POA: Diagnosis not present

## 2022-03-17 DIAGNOSIS — T783XXA Angioneurotic edema, initial encounter: Secondary | ICD-10-CM | POA: Diagnosis not present

## 2022-03-17 DIAGNOSIS — J3089 Other allergic rhinitis: Secondary | ICD-10-CM | POA: Diagnosis not present

## 2022-03-17 DIAGNOSIS — L500 Allergic urticaria: Secondary | ICD-10-CM | POA: Diagnosis not present

## 2022-03-29 DIAGNOSIS — R5383 Other fatigue: Secondary | ICD-10-CM | POA: Diagnosis not present

## 2022-03-29 DIAGNOSIS — R300 Dysuria: Secondary | ICD-10-CM | POA: Diagnosis not present

## 2022-03-29 DIAGNOSIS — M7989 Other specified soft tissue disorders: Secondary | ICD-10-CM | POA: Diagnosis not present

## 2022-03-29 DIAGNOSIS — E559 Vitamin D deficiency, unspecified: Secondary | ICD-10-CM | POA: Diagnosis not present

## 2022-05-13 DIAGNOSIS — M5412 Radiculopathy, cervical region: Secondary | ICD-10-CM | POA: Diagnosis not present

## 2022-08-13 DIAGNOSIS — L500 Allergic urticaria: Secondary | ICD-10-CM | POA: Diagnosis not present

## 2022-08-13 DIAGNOSIS — K219 Gastro-esophageal reflux disease without esophagitis: Secondary | ICD-10-CM | POA: Diagnosis not present

## 2022-08-13 DIAGNOSIS — J3089 Other allergic rhinitis: Secondary | ICD-10-CM | POA: Diagnosis not present

## 2022-08-19 DIAGNOSIS — J208 Acute bronchitis due to other specified organisms: Secondary | ICD-10-CM | POA: Diagnosis not present

## 2022-10-22 DIAGNOSIS — H10013 Acute follicular conjunctivitis, bilateral: Secondary | ICD-10-CM | POA: Diagnosis not present

## 2022-12-02 DIAGNOSIS — R079 Chest pain, unspecified: Secondary | ICD-10-CM | POA: Diagnosis not present

## 2022-12-02 DIAGNOSIS — R450 Nervousness: Secondary | ICD-10-CM | POA: Diagnosis not present

## 2022-12-02 DIAGNOSIS — R110 Nausea: Secondary | ICD-10-CM | POA: Diagnosis not present

## 2023-01-17 DIAGNOSIS — M25511 Pain in right shoulder: Secondary | ICD-10-CM | POA: Diagnosis not present

## 2023-01-17 DIAGNOSIS — M79642 Pain in left hand: Secondary | ICD-10-CM | POA: Diagnosis not present

## 2023-01-17 DIAGNOSIS — L508 Other urticaria: Secondary | ICD-10-CM | POA: Diagnosis not present

## 2023-01-17 DIAGNOSIS — R5383 Other fatigue: Secondary | ICD-10-CM | POA: Diagnosis not present

## 2023-01-17 DIAGNOSIS — R21 Rash and other nonspecific skin eruption: Secondary | ICD-10-CM | POA: Diagnosis not present

## 2023-01-17 DIAGNOSIS — M79641 Pain in right hand: Secondary | ICD-10-CM | POA: Diagnosis not present

## 2023-02-18 DIAGNOSIS — R21 Rash and other nonspecific skin eruption: Secondary | ICD-10-CM | POA: Diagnosis not present

## 2023-02-18 DIAGNOSIS — M1991 Primary osteoarthritis, unspecified site: Secondary | ICD-10-CM | POA: Diagnosis not present

## 2023-02-18 DIAGNOSIS — L508 Other urticaria: Secondary | ICD-10-CM | POA: Diagnosis not present

## 2023-02-18 DIAGNOSIS — M79641 Pain in right hand: Secondary | ICD-10-CM | POA: Diagnosis not present

## 2023-03-07 DIAGNOSIS — R109 Unspecified abdominal pain: Secondary | ICD-10-CM | POA: Diagnosis not present

## 2023-03-07 DIAGNOSIS — K5900 Constipation, unspecified: Secondary | ICD-10-CM | POA: Diagnosis not present

## 2023-03-07 DIAGNOSIS — R1013 Epigastric pain: Secondary | ICD-10-CM | POA: Diagnosis not present

## 2023-03-07 DIAGNOSIS — K2100 Gastro-esophageal reflux disease with esophagitis, without bleeding: Secondary | ICD-10-CM | POA: Diagnosis not present

## 2023-04-19 DIAGNOSIS — R079 Chest pain, unspecified: Secondary | ICD-10-CM | POA: Diagnosis not present

## 2023-04-19 DIAGNOSIS — K2100 Gastro-esophageal reflux disease with esophagitis, without bleeding: Secondary | ICD-10-CM | POA: Diagnosis not present

## 2023-05-03 DIAGNOSIS — R5383 Other fatigue: Secondary | ICD-10-CM | POA: Diagnosis not present

## 2023-05-03 DIAGNOSIS — R0789 Other chest pain: Secondary | ICD-10-CM | POA: Diagnosis not present

## 2023-05-03 DIAGNOSIS — K2100 Gastro-esophageal reflux disease with esophagitis, without bleeding: Secondary | ICD-10-CM | POA: Diagnosis not present

## 2023-05-03 DIAGNOSIS — F5104 Psychophysiologic insomnia: Secondary | ICD-10-CM | POA: Diagnosis not present

## 2023-05-03 DIAGNOSIS — F411 Generalized anxiety disorder: Secondary | ICD-10-CM | POA: Diagnosis not present

## 2023-05-20 DIAGNOSIS — L508 Other urticaria: Secondary | ICD-10-CM | POA: Diagnosis not present

## 2023-05-20 DIAGNOSIS — M1380 Other specified arthritis, unspecified site: Secondary | ICD-10-CM | POA: Diagnosis not present

## 2023-05-20 DIAGNOSIS — M79641 Pain in right hand: Secondary | ICD-10-CM | POA: Diagnosis not present

## 2023-05-20 DIAGNOSIS — M79642 Pain in left hand: Secondary | ICD-10-CM | POA: Diagnosis not present

## 2023-07-14 DIAGNOSIS — H40033 Anatomical narrow angle, bilateral: Secondary | ICD-10-CM | POA: Diagnosis not present

## 2023-07-14 DIAGNOSIS — H04123 Dry eye syndrome of bilateral lacrimal glands: Secondary | ICD-10-CM | POA: Diagnosis not present

## 2023-08-01 ENCOUNTER — Encounter: Payer: Self-pay | Admitting: Nurse Practitioner

## 2023-08-04 DIAGNOSIS — R519 Headache, unspecified: Secondary | ICD-10-CM | POA: Diagnosis not present

## 2023-08-04 DIAGNOSIS — H00019 Hordeolum externum unspecified eye, unspecified eyelid: Secondary | ICD-10-CM | POA: Diagnosis not present

## 2023-08-17 ENCOUNTER — Emergency Department (HOSPITAL_BASED_OUTPATIENT_CLINIC_OR_DEPARTMENT_OTHER)
Admission: EM | Admit: 2023-08-17 | Discharge: 2023-08-17 | Disposition: A | Discharge status: Home / Self Care | Payer: BC Managed Care – PPO | Attending: Emergency Medicine | Admitting: Emergency Medicine

## 2023-08-17 ENCOUNTER — Emergency Department (HOSPITAL_BASED_OUTPATIENT_CLINIC_OR_DEPARTMENT_OTHER): Payer: BC Managed Care – PPO

## 2023-08-17 ENCOUNTER — Other Ambulatory Visit: Payer: Self-pay

## 2023-08-17 ENCOUNTER — Encounter (HOSPITAL_BASED_OUTPATIENT_CLINIC_OR_DEPARTMENT_OTHER): Payer: Self-pay

## 2023-08-17 DIAGNOSIS — E039 Hypothyroidism, unspecified: Secondary | ICD-10-CM | POA: Diagnosis not present

## 2023-08-17 DIAGNOSIS — H00011 Hordeolum externum right upper eyelid: Secondary | ICD-10-CM | POA: Diagnosis not present

## 2023-08-17 DIAGNOSIS — H5789 Other specified disorders of eye and adnexa: Secondary | ICD-10-CM | POA: Diagnosis not present

## 2023-08-17 DIAGNOSIS — R29810 Facial weakness: Secondary | ICD-10-CM | POA: Insufficient documentation

## 2023-08-17 DIAGNOSIS — Z79899 Other long term (current) drug therapy: Secondary | ICD-10-CM | POA: Diagnosis not present

## 2023-08-17 DIAGNOSIS — H5711 Ocular pain, right eye: Secondary | ICD-10-CM | POA: Diagnosis not present

## 2023-08-17 DIAGNOSIS — Q159 Congenital malformation of eye, unspecified: Secondary | ICD-10-CM

## 2023-08-17 DIAGNOSIS — H0520 Unspecified exophthalmos: Secondary | ICD-10-CM | POA: Diagnosis not present

## 2023-08-17 LAB — URINALYSIS, ROUTINE W REFLEX MICROSCOPIC
Bilirubin Urine: NEGATIVE
Glucose, UA: NEGATIVE mg/dL
Hgb urine dipstick: NEGATIVE
Ketones, ur: NEGATIVE mg/dL
Leukocytes,Ua: NEGATIVE
Nitrite: NEGATIVE
Protein, ur: NEGATIVE mg/dL
Specific Gravity, Urine: <1.005 — ABNORMAL LOW (ref 1.005–1.030)
pH: 6 (ref 5.0–8.0)

## 2023-08-17 LAB — BASIC METABOLIC PANEL
Anion gap: 7 (ref 5–15)
BUN: 7 mg/dL (ref 6–20)
CO2: 28 mmol/L (ref 22–32)
Calcium: 9.2 mg/dL (ref 8.9–10.3)
Chloride: 103 mmol/L (ref 98–111)
Creatinine, Ser: 0.81 mg/dL (ref 0.44–1.00)
GFR, Estimated: >60 mL/min (ref >60)
Glucose, Bld: 116 mg/dL — ABNORMAL HIGH (ref 70–99)
Potassium: 4.1 mmol/L (ref 3.5–5.1)
Sodium: 138 mmol/L (ref 135–145)

## 2023-08-17 LAB — CBC
HCT: 37.3 % (ref 36.0–46.0)
Hemoglobin: 12.4 g/dL (ref 12.0–15.0)
MCH: 27.9 pg (ref 26.0–34.0)
MCHC: 33.2 g/dL (ref 30.0–36.0)
MCV: 83.8 fL (ref 80.0–100.0)
Platelets: 239 10*3/uL (ref 150–400)
RBC: 4.45 MIL/uL (ref 3.87–5.11)
RDW: 13.2 % (ref 11.5–15.5)
WBC: 7.9 10*3/uL (ref 4.0–10.5)
nRBC: 0 % (ref 0.0–0.2)

## 2023-08-17 LAB — TSH: TSH: 3.191 u[IU]/mL (ref 0.350–4.500)

## 2023-08-17 NOTE — ED Triage Notes (Addendum)
Patient arrives with complaints of right eye problem issue. Patient reports increased right eye.  She also reports recently being treated for a stye 2 weeks ago. Sent here for further evaluation since her eye physician is concerned about Graves Disease and eye drooping.

## 2023-08-17 NOTE — Discharge Instructions (Signed)
You have been evaluated for your symptoms.  Head CT scan today did not show any concerning finding.  Your blood work are overall reassuring.  Your thyroid test is normal.  Please follow-up closely with your doctor for further care.  If you develop tingling sensation to the right side of your face, having trouble blinking your eye, or having vision changes, do not hesitate to return to ER for further evaluation which may include MRI of your brain.

## 2023-08-17 NOTE — ED Notes (Signed)
Patient transported to CT 

## 2023-08-17 NOTE — ED Provider Notes (Signed)
[place] [facility] AT [facility] Provider Note   CSN: [id number] Arrival date & time: 08/17/23  1554     History  Chief Complaint  Patient presents with   Eye Problem    right   Facial Droop    Denise Booth is a 57 y.o. female.  The history is provided by the patient and medical records. No language interpreter was used.  Eye Problem    57 year old female with history of thyroid disease, migraine, GERD, presented to ED with complaint of eye problem.  Patient states for the past 2 to 3 months she has had a bump on top of her right eyelid.  States she was initially evaluated by an eye specialist for this bump and was prescribed doxycycline.  She took it for a period of time with some improvement but it came back.  She was seen at the urgent care center and was prescribed erythromycin ointment.  She also has been using that without any relief.  For the past several days she complaining of irritation to her right eye when she tried to apply erythromycin ointment.  She went to see an eye specialist today who evaluated her.  She was told that she does not have any signs of eye infection and no evidence of stye.  However ophthalmologist voiced concern that patient may have Graves' disease because her right eye was bulging. She also was told she has a right facial droop.  Pt denies tingling/numbness to R side of face. States ocular pressure was checked as well.  Patient was given a referral to an office for outpatient follow-up.  She mention she can follow-up with her PCP in time and decided come to the ER for further assessment.  She did admits to having some increased weight gain for the past several months.  She also has history of hypothyroidism and was on 25 mcg of levothyroxine but ran out of her medication approximately 2 years ago and have not resumed.  She denies any heat or cold sensitivity.  Denies any heart palpitation.  No complaints of skin changes.  She  does endorse occasional constipation and at other times have some diarrhea.  Home Medications Prior to Admission medications   Medication Sig Start Date End Date Taking? Authorizing Provider  acetaminophen (TYLENOL) 500 MG tablet Take 1,000 mg by mouth every 6 (six) hours as needed for moderate pain.    [provider]  ciprofloxacin (CIPRO) 500 MG tablet Take 500 mg by mouth 2 (two) times daily.    [provider]  loratadine (CLARITIN) 10 MG tablet Take 10 mg by mouth daily as needed for allergies.    [provider]  pantoprazole (PROTONIX) 40 MG tablet Take 1 tablet (40 mg total) by mouth daily. 06/11/20   Georgetta Haber, NP  Rimegepant Sulfate (NURTEC) 75 MG TBDP Take 75 mg by mouth daily as needed (migraine).    [provider]      Allergies    Clarithromycin, Phenazopyridine, Codeine sulfate, Pyridium [phenazopyridine hcl], Tomato, Tramadol, Ceftin [cefuroxime axetil], Codeine, Flagyl [metronidazole hcl], Hydrocodone, Penicillins, and Sulfa antibiotics    Review of Systems   Review of Systems  All other systems reviewed and are negative.   Physical Exam Updated Vital Signs BP (!) 170/83 (BP Location: Right Arm)   Pulse 77   Temp 97.9 F (36.6 C) (Oral)   Resp 20   SpO2 100%  Physical Exam Vitals and nursing note reviewed.  Constitutional:  General: She is not in acute distress.    Appearance: She is well-developed.  HENT:     Head: Normocephalic and atraumatic.  Eyes:     General: Lids are normal. Lids are everted, no foreign bodies appreciated. Vision grossly intact. Gaze aligned appropriately.     Extraocular Movements: Extraocular movements intact.     Conjunctiva/sclera: Conjunctivae normal.  Neck:     Comments: Trachea midline no goiter no thyromegaly Cardiovascular:     Rate and Rhythm: Normal rate and regular rhythm.  Pulmonary:     Effort: Pulmonary effort is normal.  Abdominal:     Palpations: Abdomen is soft.      Tenderness: There is no abdominal tenderness.  Musculoskeletal:        General: Normal range of motion.     Cervical back: Normal range of motion and neck supple.  Skin:    Findings: No rash.  Neurological:     Mental Status: She is alert and oriented to person, place, and time.     GCS: GCS eye subscore is 4. GCS verbal subscore is 5. GCS motor subscore is 6.     Cranial Nerves: Cranial nerves 2-12 are intact.     Sensory: Sensation is intact.     Motor: Motor function is intact.  Psychiatric:        Mood and Affect: Mood normal.     ED Results / Procedures / Treatments   Labs (all labs ordered are listed, but only abnormal results are displayed) Labs Reviewed  BASIC METABOLIC PANEL - Abnormal; Notable for the following components:      Result Value   Glucose, Bld 116 (*)    All other components within normal limits  URINALYSIS, ROUTINE W REFLEX MICROSCOPIC - Abnormal; Notable for the following components:   Color, Urine COLORLESS (*)    Specific Gravity, Urine <1.005 (*)    All other components within normal limits  CBC  TSH  CBG MONITORING, ED    EKG None  Radiology CT Head Wo Contrast  Result Date: 08/17/2023 CLINICAL DATA:  Right eye complaint, stye EXAM: CT HEAD WITHOUT CONTRAST TECHNIQUE: Contiguous axial images were obtained from the base of the skull through the vertex without intravenous contrast. RADIATION DOSE REDUCTION: This exam was performed according to the departmental dose-optimization program which includes automated exposure control, adjustment of the mA and/or kV according to patient size and/or use of iterative reconstruction technique. COMPARISON:  CT brain report 06/26/2006 FINDINGS: Brain: No acute territorial infarction, hemorrhage or intracranial mass. The ventricles are nonenlarged Vascular: No hyperdense vessel or unexpected calcification. Skull: Normal. Negative for fracture or focal lesion. Sinuses/Orbits: No acute finding. Other: None  IMPRESSION: Negative non contrasted CT appearance of the brain. Electronically Signed   By: Jasmine Pang M.D.   On: 08/17/2023 20:15    Procedures Procedures    Medications Ordered in ED Medications - No data to display  ED Course/ Medical Decision Making/ A&P                                 Medical Decision Making Amount and/or Complexity of Data Reviewed Labs: ordered. Radiology: ordered.   BP (!) 170/83 (BP Location: Right Arm)   Pulse 77   Temp 97.9 F (36.6 C) (Oral)   Resp 20   SpO2 100%   66:62 PM 57 year old female with history of thyroid disease, migraine, GERD, presented to ED with complaint  of eye problem.  Patient states for the past 2 to 3 months she has had a bump on top of her right eyelid.  States she was initially evaluated by an eye specialist for this bump and was prescribed doxycycline.  She took it for a period of time with some improvement but it came back.  She was seen at the urgent care center and was prescribed erythromycin ointment.  She also has been using that without any relief.  For the past several days she complaining of irritation to her right eye when she tried to apply erythromycin ointment.  She went to see an eye specialist today who evaluated her.  She was told that she does not have any signs of eye infection and no evidence of stye.  However ophthalmologist voiced concern that patient may have Graves' disease because her right eye was bulging. She also was told she has a right facial droop.  Pt denies tingling/numbness to R side of face. States ocular pressure was checked as well.  Patient was given a referral to an office for outpatient follow-up.  She mention she can follow-up with her PCP in time and decided come to the ER for further assessment.  She did admits to having some increased weight gain for the past several months.  She also has history of hypothyroidism and was on 25 mcg of levothyroxine but ran out of her medication approximately 2  years ago and have not resumed.  She denies any heat or cold sensitivity.  Denies any heart palpitation.  No complaints of skin changes.  She does endorse occasional constipation and at other times have some diarrhea.  On exam, patient is resting comfortably in bed appears to be in no acute discomfort.  Facial exam without any asymmetry, no cranial nerve deficit, eye exam unremarkable there is no evidence of exophthalmos, no evidence of stye.  No thyromegaly or goiter noted.  Heart with normal rate and rhythm, lungs clear to auscultation bilaterally abdomen is soft nontender equal strength throughout.  -Labs ordered, independently viewed and interpreted by me.  Labs remarkable for normal TSH, electrolytes are reassuring, UA without UTI -The patient was maintained on a cardiac monitor.  I personally viewed and interpreted the cardiac monitored which showed an underlying rhythm of: NSR -Imaging independently viewed and interpreted by me and I agree with radiologist's interpretation.  Result remarkable for head CT unremarkable.  Brain MRI considered and offer but pt declined -This patient presents to the ED for concern of concerns of graves disease due to eyelid discomfort, this involves an extensive number of treatment options, and is a complaint that carries with it a high risk of complications and morbidity.  The differential diagnosis includes Bell's palsy, stroke, hordeolum, chalazion, astigmatism, graves disease -Co morbidities that complicate the patient evaluation includes thyroid disease -Treatment includes reassurance -Reevaluation of the patient after these medicines showed that the patient stayed the same -PCP office notes or outside notes reviewed -Escalation to admission/observation considered: patients feels much better, is comfortable with discharge, and will follow up with PCP -Prescription medication considered, patient comfortable with OTC meds -Social Determinant of Health considered    8:39 PM Workup overall reassuring.  No significant facial droop appreciated on my initial exam.  She has no focal neurodeficit and no vision changes.  Patient also does not endorse any paresthesia about her face, no trouble with her vision, no problem with eye movement and no focal weakness.  Pupil equal round reactive and excellent  movement is intact.  R eye Complaint has been ongoing issue for several months.  At this time I have not identified any acute emergent medical condition.  Although MRI considered after further discussion we felt that is low yield.  I gave patient strict return precaution but otherwise she is stable for discharge.          Final Clinical Impression(s) / ED Diagnoses Final diagnoses:  Eye abnormality    Rx / DC Orders ED Discharge Orders     None         Fayrene Helper, PA-C 08/17/23 2041    Benjiman Core, MD 08/17/23 2310

## 2023-09-09 DIAGNOSIS — Z79899 Other long term (current) drug therapy: Secondary | ICD-10-CM | POA: Diagnosis not present

## 2023-09-09 DIAGNOSIS — L508 Other urticaria: Secondary | ICD-10-CM | POA: Diagnosis not present

## 2023-09-09 DIAGNOSIS — M1380 Other specified arthritis, unspecified site: Secondary | ICD-10-CM | POA: Diagnosis not present

## 2023-09-09 DIAGNOSIS — Z6841 Body Mass Index (BMI) 40.0 and over, adult: Secondary | ICD-10-CM | POA: Diagnosis not present

## 2023-10-26 ENCOUNTER — Ambulatory Visit: Payer: BC Managed Care – PPO | Admitting: Nurse Practitioner

## 2023-12-20 ENCOUNTER — Ambulatory Visit: Payer: BC Managed Care – PPO | Admitting: Family Medicine

## 2023-12-30 ENCOUNTER — Ambulatory Visit: Payer: BC Managed Care – PPO | Admitting: Family Medicine

## 2023-12-30 ENCOUNTER — Encounter: Payer: Self-pay | Admitting: Family Medicine

## 2023-12-30 VITALS — BP 130/72 | HR 79 | Temp 98.5°F | Wt 282.0 lb

## 2023-12-30 DIAGNOSIS — Z8249 Family history of ischemic heart disease and other diseases of the circulatory system: Secondary | ICD-10-CM | POA: Diagnosis not present

## 2023-12-30 DIAGNOSIS — K219 Gastro-esophageal reflux disease without esophagitis: Secondary | ICD-10-CM

## 2023-12-30 DIAGNOSIS — G5603 Carpal tunnel syndrome, bilateral upper limbs: Secondary | ICD-10-CM | POA: Diagnosis not present

## 2023-12-30 DIAGNOSIS — M1380 Other specified arthritis, unspecified site: Secondary | ICD-10-CM | POA: Diagnosis not present

## 2023-12-30 DIAGNOSIS — Z79899 Other long term (current) drug therapy: Secondary | ICD-10-CM | POA: Diagnosis not present

## 2023-12-30 MED ORDER — DEXLANSOPRAZOLE 60 MG PO CPDR: 60.0000 mg | DELAYED_RELEASE_CAPSULE | Freq: Every day | ORAL | 1 refills | Status: AC

## 2023-12-30 NOTE — Progress Notes (Signed)
 Established Patient Office Visit  Subjective   Patient ID: Denise Booth, female    DOB: August 19, 1966  Age: 58 y.o. MRN: [id number]  Chief Complaint  Patient presents with   Gastroesophageal Reflux   Abdominal Pain    Patient complains of abdominal pain, x2 weeks    Tachycardia    HPI   Denise Booth is a 58 year old female with history of migraine headaches, GERD, obesity.  Her husband died suddenly and unexpectedly over 2 years ago from sudden MI.  He did have known cardiac history.  This is naturally been very difficult for her.  She does have very supportive family and overall coping fairly well.  She does have a longstanding history of GERD and states for past couple weeks particularly she has had increased symptoms.  Back in 2018 she had EGD and was diagnosed with H. pylori.  She also reportedly had recent positive breath test for H. pylori but she has allergies to multiple antibiotics which excluded optimal treatment.  She recently has had increased belching symptoms and increased GERD symptoms especially at night.  She has been taking Pepcid 20 mg 2 tablets at night which have helped somewhat but she has had frequent breakthrough symptoms.  Occasional sensation of solid foods hanging up in esophagus but not regularly.  She took Protonix but did not see much improvement with that but did previously take Dexilant and felt like that was more effective for her GERD.  She has struggled with her weight for quite some time.  She knows this is likely contributing.  She has recently had increased arthralgias and went to rheumatologist and was just diagnosed with RA reportedly.  Did not tolerate methotrexate.  Currently not being treated with any specific rheumatoid medications.  She does have family history of CAD both in her mother and also sister who had MI at age 52 a year ago.  Her mom reportedly had coronary disease early 70s.  Denise Booth has not had any recent lipids.  Has previously had low  HDL cholesterol.  She denies any recent exertional chest pains.  She is a non-smoker.  No history of diabetes  Past Medical History:  Diagnosis Date   Allergy    Chicken pox    GERD (gastroesophageal reflux disease)    Migraine headache    Thyroid disease    Ulcer    Past Surgical History:  Procedure Laterality Date   ABDOMINAL HYSTERECTOMY     CHOLECYSTECTOMY     TUBAL LIGATION      reports that she has never smoked. She has never used smokeless tobacco. She reports that she does not drink alcohol and does not use drugs. family history is not on file. Allergies  Allergen Reactions   Clarithromycin Hives   Phenazopyridine Hives   Codeine Sulfate Itching and Nausea And Vomiting   Pyridium [Phenazopyridine Hcl] Hives   Tomato Hives    Face breaks out   Tramadol Itching and Nausea And Vomiting   Ceftin [Cefuroxime Axetil] Itching and Rash   Codeine Itching and Nausea And Vomiting   Flagyl [Metronidazole Hcl] Rash   Hydrocodone Itching and Nausea And Vomiting   Penicillins Rash   Sulfa Antibiotics Rash     Review of Systems  Constitutional:  Negative for malaise/fatigue.  Eyes:  Negative for blurred vision.  Respiratory:  Negative for shortness of breath.   Cardiovascular:  Negative for chest pain.  Gastrointestinal:  Negative for blood in stool and melena.  Neurological:  Negative for  dizziness, weakness and headaches.      Objective:     BP 130/72 (BP Location: Left Arm, Patient Position: Sitting, Cuff Size: Large)   Pulse 79   Temp 98.5 F (36.9 C) (Oral)   Wt 282 lb (127.9 kg)   SpO2 96%   BMI 44.17 kg/m  BP Readings from Last 3 Encounters:  12/30/23 130/72  08/17/23 (!) 129/53  03/19/21 128/70   Wt Readings from Last 3 Encounters:  12/30/23 282 lb (127.9 kg)  08/17/23 280 lb (127 kg)  01/12/21 295 lb 6.4 oz (134 kg)      Physical Exam Vitals reviewed.  Constitutional:      Appearance: She is well-developed.  Eyes:     Pupils: Pupils are  equal, round, and reactive to light.  Neck:     Thyroid: No thyromegaly.     Vascular: No JVD.  Cardiovascular:     Rate and Rhythm: Normal rate and regular rhythm.     Heart sounds:     No gallop.  Pulmonary:     Effort: Pulmonary effort is normal. No respiratory distress.     Breath sounds: Normal breath sounds. No wheezing or rales.  Abdominal:     Comments: Nondistended.  Normal bowel sounds.  Soft with mild epigastric tenderness.  No guarding or rebound.  Musculoskeletal:     Cervical back: Neck supple.  Neurological:     Mental Status: She is alert.      No results found for any visits on 12/30/23.    The ASCVD Risk score (Arnett DK, et al., 2019) failed to calculate for the following reasons:   Cannot find a previous HDL lab   Cannot find a previous total cholesterol lab    Assessment & Plan:   #1 frequent GERD symptoms not controlled with Pepcid 20 mg 2 tablets daily.  Patient has had previous GERD symptoms in the past.  Reportedly has had positive H. pylori testing but cannot take several antibiotics that are used in treatment.  She has responded favorably to Dexilant in the past.  Sent prescription for Dexilant 60 mg 1 daily and may continue supplementation with Pepcid as well. Handout on GERD given.  Set up 1 month follow-up to reassess  #2 positive family history of CAD in his sister and mother.  Recommend follow-up fasting lipids soon.  She is not fasting today.  We did discuss coronary calcium score for further risk stratification and she would like to consider that.  #3 morbid obesity.  She went to weight loss clinic during the past year up in [place] but there had been discussion regarding GLP-1's and she was not interested in that.  She does feel that the emotional stress of recently losing her husband has contributed significantly to her difficulties with weight loss.   Denise Peat, MD

## 2024-01-06 ENCOUNTER — Ambulatory Visit: Payer: BC Managed Care – PPO | Admitting: Gastroenterology

## 2024-01-12 ENCOUNTER — Emergency Department (HOSPITAL_BASED_OUTPATIENT_CLINIC_OR_DEPARTMENT_OTHER): Admitting: Radiology

## 2024-01-12 ENCOUNTER — Encounter (HOSPITAL_BASED_OUTPATIENT_CLINIC_OR_DEPARTMENT_OTHER): Payer: Self-pay | Admitting: Emergency Medicine

## 2024-01-12 ENCOUNTER — Emergency Department (HOSPITAL_BASED_OUTPATIENT_CLINIC_OR_DEPARTMENT_OTHER)
Admission: EM | Admit: 2024-01-12 | Discharge: 2024-01-12 | Disposition: A | Discharge status: Home / Self Care | Attending: Emergency Medicine | Admitting: Emergency Medicine

## 2024-01-12 ENCOUNTER — Other Ambulatory Visit: Payer: Self-pay

## 2024-01-12 DIAGNOSIS — R1013 Epigastric pain: Secondary | ICD-10-CM | POA: Diagnosis not present

## 2024-01-12 DIAGNOSIS — Z9049 Acquired absence of other specified parts of digestive tract: Secondary | ICD-10-CM | POA: Diagnosis not present

## 2024-01-12 DIAGNOSIS — K209 Esophagitis, unspecified without bleeding: Secondary | ICD-10-CM | POA: Diagnosis not present

## 2024-01-12 DIAGNOSIS — I878 Other specified disorders of veins: Secondary | ICD-10-CM | POA: Diagnosis not present

## 2024-01-12 DIAGNOSIS — K5900 Constipation, unspecified: Secondary | ICD-10-CM | POA: Diagnosis not present

## 2024-01-12 DIAGNOSIS — R0789 Other chest pain: Secondary | ICD-10-CM | POA: Diagnosis not present

## 2024-01-12 HISTORY — DX: Rheumatoid arthritis, unspecified: M06.9

## 2024-01-12 LAB — HEPATIC FUNCTION PANEL
ALT: 17 U/L (ref 0–44)
AST: 19 U/L (ref 15–41)
Albumin: 4.3 g/dL (ref 3.5–5.0)
Alkaline Phosphatase: 68 U/L (ref 38–126)
Bilirubin, Direct: <0.1 mg/dL (ref 0.0–0.2)
Total Bilirubin: 0.4 mg/dL (ref 0.0–1.2)
Total Protein: 6.9 g/dL (ref 6.5–8.1)

## 2024-01-12 LAB — BASIC METABOLIC PANEL
Anion gap: 7 (ref 5–15)
BUN: 8 mg/dL (ref 6–20)
CO2: 28 mmol/L (ref 22–32)
Calcium: 9 mg/dL (ref 8.9–10.3)
Chloride: 104 mmol/L (ref 98–111)
Creatinine, Ser: 0.79 mg/dL (ref 0.44–1.00)
GFR, Estimated: >60 mL/min (ref >60)
Glucose, Bld: 102 mg/dL — ABNORMAL HIGH (ref 70–99)
Potassium: 4.4 mmol/L (ref 3.5–5.1)
Sodium: 139 mmol/L (ref 135–145)

## 2024-01-12 LAB — CBC
HCT: 37.6 % (ref 36.0–46.0)
Hemoglobin: 12.5 g/dL (ref 12.0–15.0)
MCH: 28 pg (ref 26.0–34.0)
MCHC: 33.2 g/dL (ref 30.0–36.0)
MCV: 84.3 fL (ref 80.0–100.0)
Platelets: 218 10*3/uL (ref 150–400)
RBC: 4.46 MIL/uL (ref 3.87–5.11)
RDW: 13.2 % (ref 11.5–15.5)
WBC: 6.9 10*3/uL (ref 4.0–10.5)
nRBC: 0 % (ref 0.0–0.2)

## 2024-01-12 LAB — URINALYSIS, ROUTINE W REFLEX MICROSCOPIC
Bilirubin Urine: NEGATIVE
Glucose, UA: NEGATIVE mg/dL
Hgb urine dipstick: NEGATIVE
Ketones, ur: NEGATIVE mg/dL
Leukocytes,Ua: NEGATIVE
Nitrite: NEGATIVE
Protein, ur: NEGATIVE mg/dL
Specific Gravity, Urine: 1.018 (ref 1.005–1.030)
pH: 6.5 (ref 5.0–8.0)

## 2024-01-12 LAB — TROPONIN I (HIGH SENSITIVITY)
Troponin I (High Sensitivity): 3 ng/L (ref <18)
Troponin I (High Sensitivity): 2 ng/L (ref <18)

## 2024-01-12 LAB — LIPASE, BLOOD: Lipase: 14 U/L (ref 11–51)

## 2024-01-12 MED ORDER — ALUM & MAG HYDROXIDE-SIMETH 200-200-20 MG/5ML PO SUSP
30.0000 mL | Freq: Once | ORAL | Status: AC
  Administered 2024-01-12: 30 mL via ORAL
  Filled 2024-01-12: qty 30

## 2024-01-12 MED ORDER — ONDANSETRON HCL 4 MG/2ML IJ SOLN
4.0000 mg | Freq: Once | INTRAMUSCULAR | Status: DC
  Filled 2024-01-12: qty 2

## 2024-01-12 MED ORDER — LIDOCAINE VISCOUS HCL 2 % MT SOLN
15.0000 mL | Freq: Once | OROMUCOSAL | Status: AC
  Administered 2024-01-12: 15 mL via ORAL
  Filled 2024-01-12: qty 15

## 2024-01-12 NOTE — ED Provider Notes (Signed)
 [place] [facility] DEPARTMENT AT [facility] Provider Note   CSN: [id number] Arrival date & time: 01/12/24  4098     History  Chief Complaint  Patient presents with   Abdominal Pain    Denise Booth is a 58 y.o. female with past medical history significant for obesity, GERD, gastroenteritis, esophagitis, belching, bloating who presents with concern for upper abdominal pain, constipation, with some nausea over the last 6 days, with significant pain this morning upon waking.  Denies any radiation of pain to arm, does report some radiation of pain to her back.  She denies any shortness of breath.   Abdominal Pain      Home Medications Prior to Admission medications   Medication Sig Start Date End Date Taking? Authorizing Provider  acetaminophen (TYLENOL) 500 MG tablet Take 1,000 mg by mouth every 6 (six) hours as needed for moderate pain.    [provider]  Celecoxib (CELEBREX PO) Take by mouth.    [provider]  dexlansoprazole (DEXILANT) 60 MG capsule Take 1 capsule (60 mg total) by mouth daily. 12/30/23   Burchette, Elberta Fortis, MD  loratadine (CLARITIN) 10 MG tablet Take 10 mg by mouth daily as needed for allergies.    [provider]  Rimegepant Sulfate (NURTEC) 75 MG TBDP Take 75 mg by mouth daily as needed (migraine).    [provider]      Allergies    Clarithromycin, Phenazopyridine, Codeine sulfate, Pyridium [phenazopyridine hcl], Tomato, Tramadol, Ceftin [cefuroxime axetil], Codeine, Flagyl [metronidazole hcl], Hydrocodone, Penicillins, and Sulfa antibiotics    Review of Systems   Review of Systems  Gastrointestinal:  Positive for abdominal pain.  All other systems reviewed and are negative.   Physical Exam Updated Vital Signs BP 130/66   Pulse 65   Temp 97.9 F (36.6 C)   Resp 15   SpO2 99%  Physical Exam Vitals and nursing note reviewed.  Constitutional:      General: She is not in acute distress.     Appearance: Normal appearance.  HENT:     Head: Normocephalic and atraumatic.  Eyes:     General:        Right eye: No discharge.        Left eye: No discharge.  Cardiovascular:     Rate and Rhythm: Normal rate and regular rhythm.     Heart sounds: No murmur heard.    No friction rub. No gallop.  Pulmonary:     Effort: Pulmonary effort is normal.     Breath sounds: Normal breath sounds.  Abdominal:     General: Bowel sounds are normal.     Palpations: Abdomen is soft.     Comments: Some tender to palpation in the epigastric region, no rebound, rigidity, guarding, no tenderness to palpation of the lower abdomen.  Normal bowel sounds throughout.  No distention noted.  Skin:    General: Skin is warm and dry.     Capillary Refill: Capillary refill takes less than 2 seconds.  Neurological:     Mental Status: She is alert and oriented to person, place, and time.  Psychiatric:        Mood and Affect: Mood normal.        Behavior: Behavior normal.     ED Results / Procedures / Treatments   Labs (all labs ordered are listed, but only abnormal results are displayed) Labs Reviewed  BASIC METABOLIC PANEL - Abnormal; Notable for the following components:  Result Value   Glucose, Bld 102 (*)    All other components within normal limits  CBC  LIPASE, BLOOD  HEPATIC FUNCTION PANEL  URINALYSIS, ROUTINE W REFLEX MICROSCOPIC  TROPONIN I (HIGH SENSITIVITY)  TROPONIN I (HIGH SENSITIVITY)    EKG EKG Interpretation Date/Time:  Thursday January 12 2024 10:02:14 EST Ventricular Rate:  75 PR Interval:  138 QRS Duration:  84 QT Interval:  388 QTC Calculation: 433 R Axis:   30  Text Interpretation: Normal sinus rhythm Normal ECG When compared with ECG of 19-Mar-2021 13:52, PREVIOUS ECG IS PRESENT No significant change since last tracing Confirmed by Jacalyn Lefevre [phone]) on 01/12/2024 1:23:16 PM  Radiology No results found.  Procedures Procedures    Medications Ordered in  ED Medications  ondansetron (ZOFRAN) injection 4 mg (0 mg Intravenous Hold 01/12/24 1133)  alum & mag hydroxide-simeth (MAALOX/MYLANTA) 200-200-20 MG/5ML suspension 30 mL (30 mLs Oral Given 01/12/24 1110)    And  lidocaine (XYLOCAINE) 2 % viscous mouth solution 15 mL (15 mLs Oral Given 01/12/24 1110)    ED Course/ Medical Decision Making/ A&P                                 Medical Decision Making Amount and/or Complexity of Data Reviewed Labs: ordered. Radiology: ordered.  Risk OTC drugs. Prescription drug management.   This patient is a 58 y.o. female  who presents to the ED for concern of abdominal pain.   Differential diagnoses prior to evaluation: The emergent differential diagnosis includes, but is not limited to,  The causes of generalized abdominal pain include but are not limited to AAA, mesenteric ischemia, appendicitis, diverticulitis, DKA, gastritis, gastroenteritis, AMI, nephrolithiasis, pancreatitis, peritonitis, adrenal insufficiency,lead poisoning, iron toxicity, intestinal ischemia, constipation, UTI,SBO/LBO, splenic rupture, biliary disease, IBD, IBS, PUD, or hepatitis . This is not an exhaustive differential.   Past Medical History / Co-morbidities / Social History: obesity, GERD, gastroenteritis, esophagitis, belching, bloating  Additional history: Chart reviewed. Pertinent results include: No previous echo or stress test to compare, she is scheduled for CT coronary for cardiac evaluation later this year.  Physical Exam: Physical exam performed. The pertinent findings include: Some tenderness to palpation of the epigastric region, otherwise unremarkable abdominal exam, stable vital signs in the emergency department other than some hypertension on arrival, blood pressure 169/77.  Lab Tests/Imaging studies: I personally interpreted labs/imaging and the pertinent results include: CBC unremarkable, UA unremarkable, BMP unremarkable, hepatic function panel unremarkable,  negative troponin x 2 in context of chest pain prior to arrival that resolved on ED evaluation, lipase unremarkable.  I independently interpreted plain film radiograph of the abdomen acute with chest which shows no evidence of free air, no evidence of acute intrathoracic abnormality, no acute abdominal pathology noted.. I agree with the radiologist interpretation.  Cardiac monitoring: EKG obtained and interpreted by myself and attending physician which shows: Normal sinus rhythm, no acute ST-T changes   Medications: I ordered medication including GI cocktail.  I have reviewed the patients home medicines and have made adjustments as needed.  Patient with history of esophagitis, gastritis, improvement with GI cocktail, negative troponin x 2, no other acute abnormalities in the ED, suspect gastritis/esophagitis again for this patient, encouraged continuing home antacid medications, over-the-counter Maalox, Mylanta as needed, with close GI follow-up   Disposition: After consideration of the diagnostic results and the patients response to treatment, I feel that patient is stable for discharge with  plan as above.   emergency department workup does not suggest an emergent condition requiring admission or immediate intervention beyond what has been performed at this time. The plan is: as above. The patient is safe for discharge and has been instructed to return immediately for worsening symptoms, change in symptoms or any other concerns.  Final Clinical Impression(s) / ED Diagnoses Final diagnoses:  Esophagitis    Rx / DC Orders ED Discharge Orders     None         [place] 01/12/24 1332    Jacalyn Lefevre, MD 01/12/24 1452

## 2024-01-12 NOTE — ED Triage Notes (Signed)
 Pt c/o epigastric pain radiating to back and chest with nausea and constipation x 6 days. Reports small BM today. Recent increase in famotidine

## 2024-01-12 NOTE — Discharge Instructions (Signed)
 Continue taking your home medication, and use Maalox, Mylanta as needed if you are having epigastric abdominal pain in the moment to help neutralize the acid that your stomach is already produced.  Follow-up closely with your PCP, GI doctor.  Please return if you have significant worsening pain, develop chest pain, shortness of breath.  It was a pleasure taking care of you today.

## 2024-01-19 ENCOUNTER — Ambulatory Visit (HOSPITAL_COMMUNITY): Payer: BC Managed Care – PPO

## 2024-01-20 DIAGNOSIS — M79642 Pain in left hand: Secondary | ICD-10-CM | POA: Diagnosis not present

## 2024-01-20 DIAGNOSIS — G5603 Carpal tunnel syndrome, bilateral upper limbs: Secondary | ICD-10-CM | POA: Diagnosis not present

## 2024-01-20 DIAGNOSIS — M180 Bilateral primary osteoarthritis of first carpometacarpal joints: Secondary | ICD-10-CM | POA: Diagnosis not present

## 2024-01-27 ENCOUNTER — Ambulatory Visit: Payer: BC Managed Care – PPO | Admitting: Family Medicine

## 2024-02-10 ENCOUNTER — Ambulatory Visit: Admitting: Family Medicine

## 2024-02-16 ENCOUNTER — Ambulatory Visit (HOSPITAL_BASED_OUTPATIENT_CLINIC_OR_DEPARTMENT_OTHER): Payer: BC Managed Care – PPO

## 2024-03-02 ENCOUNTER — Ambulatory Visit: Admitting: Family Medicine

## 2024-03-30 ENCOUNTER — Ambulatory Visit: Admitting: Family Medicine

## 2024-04-26 DIAGNOSIS — R002 Palpitations: Secondary | ICD-10-CM | POA: Diagnosis not present

## 2024-04-26 DIAGNOSIS — J329 Chronic sinusitis, unspecified: Secondary | ICD-10-CM | POA: Diagnosis not present

## 2024-04-26 DIAGNOSIS — R42 Dizziness and giddiness: Secondary | ICD-10-CM | POA: Diagnosis not present

## 2024-04-27 ENCOUNTER — Ambulatory Visit: Admitting: Family Medicine

## 2024-06-05 ENCOUNTER — Ambulatory Visit: Payer: Self-pay

## 2024-06-05 DIAGNOSIS — R110 Nausea: Secondary | ICD-10-CM | POA: Diagnosis not present

## 2024-06-05 DIAGNOSIS — R42 Dizziness and giddiness: Secondary | ICD-10-CM | POA: Diagnosis not present

## 2024-06-05 NOTE — Telephone Encounter (Addendum)
 Left message for patient to return my call to schedule appointment or advise visit to nearest urgent care

## 2024-06-05 NOTE — Telephone Encounter (Signed)
 FYI Only or Action Required?: FYI only for provider.  Patient was last seen in primary care on 12/30/2023 by Micheal Wolm ORN, MD.  Called Nurse Triage reporting Dizziness.  Symptoms began today.  Interventions attempted: OTC medications: tylenol  .  Symptoms are: unchanged.  Triage Disposition: See Physician Within 24 Hours  Patient/caregiver understands and will follow disposition?: Yes, will follow disposition  Copied from CRM #[id number]. Topic: Clinical - Red Word Triage >> Jun 05, 2024 10:29 AM Franky GRADE wrote: Red Word that prompted transfer to Nurse Triage: Patient is experiencing dizziness and nausea, starting this morning. Reason for Disposition  [1] MODERATE dizziness (e.g., vertigo; feels very unsteady, interferes with normal activities) AND [2] has NOT been evaluated by doctor (or NP/PA) for this  Answer Assessment - Initial Assessment Questions 1. DESCRIPTION: Describe your dizziness.     Looking eye level is tolerable. Looking down, reaching down, starts spinning of the room.  2. VERTIGO: Do you feel like either you or the room is spinning or tilting?      Room spinning 3. LIGHTHEADED: Do you feel lightheaded? (e.g., somewhat faint, woozy, weak upon standing)     denies 4. SEVERITY: How bad is it?  Can you walk?     Can walk and stand okay, turning to quickly causes her to feel off balance.  5. ONSET:  When did the dizziness begin?     This morning, did not feel dizzy until she got to work 6. AGGRAVATING FACTORS: Does anything make it worse? (e.g., standing, change in head position)     Turning head 7. CAUSE: What do you think is causing the dizziness?     Thought dehydration 8. RECURRENT SYMPTOM: Have you had dizziness before? If Yes, ask: When was the last time? What happened that time?     States hx of vertigo states feels similar.  9. OTHER SYMPTOMS: Do you have any other symptoms? (e.g., earache, headache, numbness, tinnitus, vomiting,  weakness)     HA x2 days, states hx of migraine but this did not feel like migraine, currently HA is in temple area bilaterally, pain is tolerable. Pt states tylenol  does help minimally. Denies vision changes. Nausea. Feels as though the back of her neck is having pressure. Denies speech changes.   LKWT 0800. Pt advised to seek medical care immediately  Protocols used: Dizziness - Vertigo-A-AH

## 2024-06-06 NOTE — Telephone Encounter (Signed)
 I spoke with the patient and she was seen at Urgent care yesterday. Patient stated she was prescribed Zofran  and Meclizine. Patient reported she is feeling better today and will f/u for any concerns

## 2024-06-29 DIAGNOSIS — M1991 Primary osteoarthritis, unspecified site: Secondary | ICD-10-CM | POA: Diagnosis not present

## 2024-06-29 DIAGNOSIS — M542 Cervicalgia: Secondary | ICD-10-CM | POA: Diagnosis not present

## 2024-06-29 DIAGNOSIS — Z79899 Other long term (current) drug therapy: Secondary | ICD-10-CM | POA: Diagnosis not present

## 2024-06-29 DIAGNOSIS — L508 Other urticaria: Secondary | ICD-10-CM | POA: Diagnosis not present

## 2024-06-29 DIAGNOSIS — M1380 Other specified arthritis, unspecified site: Secondary | ICD-10-CM | POA: Diagnosis not present

## 2024-07-12 DIAGNOSIS — H35371 Puckering of macula, right eye: Secondary | ICD-10-CM | POA: Diagnosis not present

## 2024-07-12 DIAGNOSIS — H5319 Other subjective visual disturbances: Secondary | ICD-10-CM | POA: Diagnosis not present

## 2024-10-09 ENCOUNTER — Other Ambulatory Visit: Payer: Self-pay

## 2024-10-09 ENCOUNTER — Emergency Department (HOSPITAL_COMMUNITY)
Admission: EM | Admit: 2024-10-09 | Discharge: 2024-10-09 | Disposition: A | Discharge status: Home / Self Care | Attending: Emergency Medicine | Admitting: Emergency Medicine

## 2024-10-09 ENCOUNTER — Encounter (HOSPITAL_COMMUNITY): Payer: Self-pay

## 2024-10-09 ENCOUNTER — Emergency Department (HOSPITAL_COMMUNITY)

## 2024-10-09 DIAGNOSIS — R531 Weakness: Secondary | ICD-10-CM | POA: Diagnosis not present

## 2024-10-09 DIAGNOSIS — R519 Headache, unspecified: Secondary | ICD-10-CM | POA: Diagnosis not present

## 2024-10-09 DIAGNOSIS — R42 Dizziness and giddiness: Secondary | ICD-10-CM | POA: Diagnosis not present

## 2024-10-09 DIAGNOSIS — Z79899 Other long term (current) drug therapy: Secondary | ICD-10-CM | POA: Diagnosis not present

## 2024-10-09 DIAGNOSIS — R55 Syncope and collapse: Secondary | ICD-10-CM | POA: Diagnosis not present

## 2024-10-09 LAB — CBC WITH DIFFERENTIAL/PLATELET
Abs Immature Granulocytes: 0.04 K/uL (ref 0.00–0.07)
Basophils Absolute: 0 K/uL (ref 0.0–0.1)
Basophils Relative: 1 %
Eosinophils Absolute: 0.1 K/uL (ref 0.0–0.5)
Eosinophils Relative: 2 %
HCT: 37.1 % (ref 36.0–46.0)
Hemoglobin: 12.2 g/dL (ref 12.0–15.0)
Immature Granulocytes: 1 %
Lymphocytes Relative: 22 %
Lymphs Abs: 1.6 K/uL (ref 0.7–4.0)
MCH: 27.9 pg (ref 26.0–34.0)
MCHC: 32.9 g/dL (ref 30.0–36.0)
MCV: 84.7 fL (ref 80.0–100.0)
Monocytes Absolute: 0.5 K/uL (ref 0.1–1.0)
Monocytes Relative: 7 %
Neutro Abs: 5.2 K/uL (ref 1.7–7.7)
Neutrophils Relative %: 67 %
Platelets: 234 K/uL (ref 150–400)
RBC: 4.38 MIL/uL (ref 3.87–5.11)
RDW: 13.1 % (ref 11.5–15.5)
WBC: 7.6 K/uL (ref 4.0–10.5)
nRBC: 0 % (ref 0.0–0.2)

## 2024-10-09 LAB — URINALYSIS, ROUTINE W REFLEX MICROSCOPIC
Bilirubin Urine: NEGATIVE
Glucose, UA: NEGATIVE mg/dL
Hgb urine dipstick: NEGATIVE
Ketones, ur: NEGATIVE mg/dL
Leukocytes,Ua: NEGATIVE
Nitrite: NEGATIVE
Protein, ur: NEGATIVE mg/dL
Specific Gravity, Urine: 1.005 (ref 1.005–1.030)
pH: 6 (ref 5.0–8.0)

## 2024-10-09 LAB — COMPREHENSIVE METABOLIC PANEL WITH GFR
ALT: 18 U/L (ref 0–44)
AST: 28 U/L (ref 15–41)
Albumin: 4.3 g/dL (ref 3.5–5.0)
Alkaline Phosphatase: 89 U/L (ref 38–126)
Anion gap: 13 (ref 5–15)
BUN: 8 mg/dL (ref 6–20)
CO2: 22 mmol/L (ref 22–32)
Calcium: 9 mg/dL (ref 8.9–10.3)
Chloride: 103 mmol/L (ref 98–111)
Creatinine, Ser: 0.86 mg/dL (ref 0.44–1.00)
GFR, Estimated: >60 mL/min (ref >60)
Glucose, Bld: 101 mg/dL — ABNORMAL HIGH (ref 70–99)
Potassium: 4 mmol/L (ref 3.5–5.1)
Sodium: 139 mmol/L (ref 135–145)
Total Bilirubin: 0.4 mg/dL (ref 0.0–1.2)
Total Protein: 6.9 g/dL (ref 6.5–8.1)

## 2024-10-09 LAB — MAGNESIUM: Magnesium: 2.1 mg/dL (ref 1.7–2.4)

## 2024-10-09 LAB — CBG MONITORING, ED: Glucose-Capillary: 90 mg/dL (ref 70–99)

## 2024-10-09 LAB — TROPONIN T, HIGH SENSITIVITY: Troponin T High Sensitivity: <15 ng/L (ref 0–19)

## 2024-10-09 MED ORDER — LACTATED RINGERS IV BOLUS
1000.0000 mL | Freq: Once | INTRAVENOUS | Status: AC
  Administered 2024-10-09: 1000 mL via INTRAVENOUS

## 2024-10-09 NOTE — ED Triage Notes (Signed)
 Pt BIB EMS from work. Per EMS pt felt dizzy and lightheaded at work. She became pale and felt like she was going to pass out.   Pt stated she has had ringing in her ears and indigestion. Denies nausea/vomiting or chest pain.

## 2024-10-09 NOTE — ED Notes (Signed)
 Fluids started on pt per primary nurse.

## 2024-10-09 NOTE — ED Provider Notes (Signed)
 [place] EMERGENCY DEPARTMENT AT [facility] Provider Note   CSN: [id number] Arrival date & time: 10/09/24  1714     Patient presents with: Weakness   Denise Booth is a 58 y.o. female.    Weakness Associated symptoms: headaches   Patient presents for near syncope.  Medical history includes GERD, migraines.  She also reports anxiety and panic attacks since the passing of her husband 3 years ago.  Earlier today, she was in her normal state of health.  While sitting on her computer, she had lightheadedness and blurry vision.  She went to a coworker to let her coworker know that she felt unwell.  Coworker noticed that she seemed pale.  When they checked her blood pressure, it was elevated in the range of 200s over 100s.  Patient's episode lasted for approximately 30 minutes.  She has since had a mild occipital discomfort.  She describes feeling shaky in her arms and legs.  She feels that her symptoms today were reminiscent of prior panic attacks but without the feeling of smothering.  She denies any recent medication changes.     Prior to Admission medications   Medication Sig Start Date End Date Taking? Authorizing Provider  acetaminophen  (TYLENOL ) 500 MG tablet Take 1,000 mg by mouth every 6 (six) hours as needed for moderate pain.    [provider]  Celecoxib (CELEBREX PO) Take by mouth.    [provider]  dexlansoprazole  (DEXILANT ) 60 MG capsule Take 1 capsule (60 mg total) by mouth daily. 12/30/23   Burchette, Wolm ORN, MD  loratadine (CLARITIN) 10 MG tablet Take 10 mg by mouth daily as needed for allergies.    [provider]  Rimegepant Sulfate (NURTEC) 75 MG TBDP Take 75 mg by mouth daily as needed (migraine).    [provider]    Allergies: Clarithromycin, Phenazopyridine, Codeine sulfate, Pyridium [phenazopyridine hcl], Tomato, Tramadol, Ceftin [cefuroxime axetil], Codeine, Flagyl [metronidazole hcl], Hydrocodone ,  Penicillins, and Sulfa antibiotics    Review of Systems  Eyes:  Positive for visual disturbance.  Neurological:  Positive for weakness, light-headedness and headaches.  All other systems reviewed and are negative.   Updated Vital Signs Pulse 80   Ht 5' 7 (1.702 m)   SpO2 99%   BMI 44.17 kg/m   Physical Exam Vitals and nursing note reviewed.  Constitutional:      General: She is not in acute distress.    Appearance: Normal appearance. She is well-developed. She is not ill-appearing, toxic-appearing or diaphoretic.  HENT:     Head: Normocephalic and atraumatic.     Right Ear: External ear normal.     Left Ear: External ear normal.     Nose: Nose normal.     Mouth/Throat:     Mouth: Mucous membranes are moist.  Eyes:     Extraocular Movements: Extraocular movements intact.     Conjunctiva/sclera: Conjunctivae normal.  Cardiovascular:     Rate and Rhythm: Normal rate and regular rhythm.  Pulmonary:     Effort: Pulmonary effort is normal. No respiratory distress.  Abdominal:     General: There is no distension.     Palpations: Abdomen is soft.     Tenderness: There is no abdominal tenderness.  Musculoskeletal:        General: No swelling. Normal range of motion.     Cervical back: Normal range of motion and neck supple.  Skin:    General: Skin is warm and dry.  Coloration: Skin is not jaundiced or pale.  Neurological:     General: No focal deficit present.     Mental Status: She is alert and oriented to person, place, and time.     Cranial Nerves: No cranial nerve deficit.     Sensory: No sensory deficit.     Motor: No weakness.     Coordination: Coordination normal.  Psychiatric:        Mood and Affect: Mood normal.        Behavior: Behavior normal.     (all labs ordered are listed, but only abnormal results are displayed) Labs Reviewed  COMPREHENSIVE METABOLIC PANEL WITH GFR - Abnormal; Notable for the following components:      Result Value   Glucose,  Bld 101 (*)    All other components within normal limits  URINALYSIS, ROUTINE W REFLEX MICROSCOPIC - Abnormal; Notable for the following components:   Color, Urine STRAW (*)    All other components within normal limits  CBC WITH DIFFERENTIAL/PLATELET  MAGNESIUM  CBG MONITORING, ED  TROPONIN T, HIGH SENSITIVITY  TROPONIN T, HIGH SENSITIVITY    EKG: EKG Interpretation Date/Time:  Tuesday October 09 2024 17:50:47 EST Ventricular Rate:  73 PR Interval:  146 QRS Duration:  98 QT Interval:  392 QTC Calculation: 432 R Axis:   43  Text Interpretation: Sinus rhythm Confirmed by Melvenia Motto [phone]) on 10/09/2024 6:00:42 PM  Radiology: [facility] Chest Port 1 View Result Date: 10/09/2024 CLINICAL DATA:  Near syncope. EXAM: PORTABLE CHEST 1 VIEW COMPARISON:  01/12/2024 FINDINGS: The cardiomediastinal contours are normal. The lungs are clear. Pulmonary vasculature is normal. No consolidation, pleural effusion, or pneumothorax. No acute osseous abnormalities are seen. IMPRESSION: No active disease. Electronically Signed   By: Andrea Gasman M.D.   On: 10/09/2024 18:31   CT HEAD WO CONTRAST Result Date: 10/09/2024 CLINICAL DATA:  Syncope/presyncope, cerebrovascular cause suspected EXAM: CT HEAD WITHOUT CONTRAST TECHNIQUE: Contiguous axial images were obtained from the base of the skull through the vertex without intravenous contrast. RADIATION DOSE REDUCTION: This exam was performed according to the departmental dose-optimization program which includes automated exposure control, adjustment of the mA and/or kV according to patient size and/or use of iterative reconstruction technique. COMPARISON:  Head CT 08/17/2023 FINDINGS: Brain: No intracranial hemorrhage, mass effect, or midline shift. No hydrocephalus. The basilar cisterns are patent. No evidence of territorial infarct or acute ischemia. No extra-axial or intracranial fluid collection. Vascular: No hyperdense vessel or unexpected calcification. Skull: No  fracture or focal lesion. Sinuses/Orbits: No acute finding. Other: None. IMPRESSION: Negative noncontrast head CT. Electronically Signed   By: Andrea Gasman M.D.   On: 10/09/2024 18:30     Procedures   Medications Ordered in the ED  lactated ringers bolus 1,000 mL (1,000 mLs Intravenous New Bag/Given 10/09/24 1845)                                    Medical Decision Making Amount and/or Complexity of Data Reviewed Labs: ordered. Radiology: ordered.   This patient presents to the ED for concern of near syncope, this involves an extensive number of treatment options, and is a complaint that carries with it a high risk of complications and morbidity.  The differential diagnosis includes vasovagal episode, arrhythmia, panic attack, polypharmacy, metabolic derangements   Co morbidities / Chronic conditions that complicate the patient evaluation  GERD, migraines   Additional history obtained:  Additional history obtained from EMR External records from outside source obtained and reviewed including EMS   Lab Tests:  I Ordered, and personally interpreted labs.  The pertinent results include: Hemoglobin, no leukocytosis, normal kidney function, normal electrolytes, normal troponin   Imaging Studies ordered:  I ordered imaging studies including chest x-ray, CT head I independently visualized and interpreted imaging which showed no acute findings I agree with the radiologist interpretation   Cardiac Monitoring: / EKG:  The patient was maintained on a cardiac monitor.  I personally viewed and interpreted the cardiac monitored which showed an underlying rhythm of: Sinus rhythm   Problem List / ED Course / Critical interventions / Medication management  Patient presenting for episode of near syncope.  On arrival in the ED, she is well-appearing.  She describes a mild occipital discomfort and feeling shaky in her muscles.  She declines any pain medication at this time.  On  exam, she has no focal neurologic deficits.  Breathing is unlabored.  No cardiac rubs or murmurs are appreciated.  Workup was initiated.  Lab work and imaging studies were reassuring.  Patient had no new symptoms while in the ED.  She does state that she has had right ear tinnitus for the past 6 days.  During her episode today, volume of tinnitus was louder.  EAM's and TMs were normal in appearance bilaterally.  She has been taking Celebrex as needed.  Patient to follow-up with ENT as needed.  For rheumatoid arthritis.  Patient is stable for discharge. I ordered medication including IV fluids for hydration Reevaluation of the patient after these medicines showed that the patient improved I have reviewed the patients home medicines and have made adjustments as needed  Social Determinants of Health:  Lives independently     Final diagnoses:  Near syncope    ED Discharge Orders     None          Melvenia Motto, MD 10/09/24 1907

## 2024-10-09 NOTE — ED Notes (Signed)
 Pt ambulated to restroom.

## 2024-10-09 NOTE — Discharge Instructions (Signed)
 Your test results today were reassuring.  If you would like to follow-up with a ear, nose, throat doctor for further evaluation of tinnitus, telephone number is below.  If you would like to establish care with a neurologist for management of migraines, telephone number is also below.  Return to the emergency department for any new or worsening symptoms of concern.
